# Patient Record
Sex: Male | Born: 1980 | Race: White | Hispanic: No | Marital: Married | Smoking: Current every day smoker
Health system: Southern US, Community
[De-identification: ages and names within clinical notes are randomized; demographics above are authoritative.]

## PROBLEM LIST (undated history)

## (undated) DIAGNOSIS — I10 Essential (primary) hypertension: Secondary | ICD-10-CM

## (undated) DIAGNOSIS — N189 Chronic kidney disease, unspecified: Secondary | ICD-10-CM

## (undated) DIAGNOSIS — F32A Depression, unspecified: Secondary | ICD-10-CM

## (undated) DIAGNOSIS — M5136 Other intervertebral disc degeneration, lumbar region: Secondary | ICD-10-CM

## (undated) DIAGNOSIS — K219 Gastro-esophageal reflux disease without esophagitis: Secondary | ICD-10-CM

## (undated) DIAGNOSIS — M51369 Other intervertebral disc degeneration, lumbar region without mention of lumbar back pain or lower extremity pain: Secondary | ICD-10-CM

## (undated) DIAGNOSIS — F419 Anxiety disorder, unspecified: Secondary | ICD-10-CM

## (undated) DIAGNOSIS — F329 Major depressive disorder, single episode, unspecified: Secondary | ICD-10-CM

## (undated) HISTORY — PX: CARPAL TUNNEL RELEASE: SHX101

## (undated) HISTORY — PX: EYE SURGERY: SHX253

## (undated) HISTORY — PX: NO PAST SURGERIES: SHX2092

---

## 2005-12-29 ENCOUNTER — Emergency Department: Payer: Self-pay | Admitting: Emergency Medicine

## 2005-12-30 ENCOUNTER — Emergency Department: Payer: Self-pay | Admitting: Emergency Medicine

## 2006-07-19 ENCOUNTER — Other Ambulatory Visit: Payer: Self-pay

## 2006-07-19 ENCOUNTER — Emergency Department: Payer: Self-pay | Admitting: Emergency Medicine

## 2007-03-31 ENCOUNTER — Emergency Department: Payer: Self-pay | Admitting: Emergency Medicine

## 2007-03-31 ENCOUNTER — Other Ambulatory Visit: Payer: Self-pay

## 2007-10-08 ENCOUNTER — Emergency Department: Payer: Self-pay | Admitting: Emergency Medicine

## 2009-02-21 ENCOUNTER — Ambulatory Visit (HOSPITAL_BASED_OUTPATIENT_CLINIC_OR_DEPARTMENT_OTHER): Admission: RE | Admit: 2009-02-21 | Discharge: 2009-02-21 | Payer: Self-pay | Admitting: Ophthalmology

## 2009-10-01 ENCOUNTER — Ambulatory Visit (HOSPITAL_COMMUNITY): Admission: RE | Admit: 2009-10-01 | Discharge: 2009-10-01 | Payer: Self-pay | Admitting: Orthopedic Surgery

## 2011-01-19 NOTE — Op Note (Signed)
NAME:  GREGORIO, WORLEY NO.:  [id number]   MEDICAL RECORD NO.:  [id number]          PATIENT TYPE:  AMB   LOCATION:  DSC                          FACILITY:  [facility]   PHYSICIAN:  Pasty Spillers. Maple Hudson, M.D. DATE OF BIRTH:  [birth year]   DATE OF PROCEDURE:  02/21/2009  DATE OF DISCHARGE:                               OPERATIVE REPORT   PREOPERATIVE DIAGNOSES:  1. Sensory exotropia, right eye.  2. Amblyopia, right eye.   POSTOPERATIVE DIAGNOSES:  1. Sensory exotropia, right eye.  2. Amblyopia, right eye.   PROCEDURES:  1. Right lateral rectus muscle recession, 9.0 mm.  2. Right medial rectus muscle resection, 7.0 mm.   SURGEON:  Pasty Spillers. Young, MD   ANESTHESIA:  General (laryngeal mask).   COMPLICATIONS:  None.   PROCEDURE:  After routine preoperative evaluation including informed  consent, the patient was taken to the operating room where he was  identified by me.  General anesthesia was induced without difficulty  after placement of appropriate monitors.  The patient was prepped and  draped in standard sterile fashion.  A lid speculum was placed in the  right eye.   Through an inferotemporal fornix incision through conjunctiva and Tenon  fascia, the right lateral rectus muscle was engaged on a series of  muscle hooks and cleared of its fascial attachments.  The tendon was  secured with a double-arm 6-0 Vicryl suture, with a double-locking bite  at each border of the muscle, 1 mm from the insertion.  The muscle was  disinserted and was reattached to the sclera at a measured distance of  9.0 mm posterior to the original insertion, using direct scleral passes  in crossed-swords fashion.  The suture ends were tied securely after the  position of the muscle had been checked and found to be accurate.  The  conjunctiva was closed with two 6-0 Vicryl sutures.  Through an incision  deep in the inferonasal fornix, adjacent to the plica semilunaris, the  right medial  rectus muscle was engaged on a series of muscle hooks and  carefully cleared of its fascial attachments to at least 15 mm posterior  to the insertion.  The muscle was spread between 2 self-retaining hooks.  A 2-mm bite was taken of the center of muscle belly at a measured  distance of 7.0 mm posterior to the insertion.  A knot was tied securely  at this location.  The needle at each end of the double-arm suture was  passed from the center of the muscle belly to the periphery, parallel to  and 7.0 mm posterior to the insertion.  A double-locking bite was placed  at each border of the muscle.  A resection clamp was placed on the  muscle just anterior to these sutures.  The muscle was disinserted.  Each pole suture was passed posteriorly to anteriorly through the  corresponding end of the muscle stump, then anteriorly to posteriorly  near the center of the stump, then posteriorly to anteriorly through the  center of the muscle belly, just posterior to the previously placed  knot.  All slack was removed  before the suture ends were tied securely.  The clamp was removed.  The portion of the muscle anterior to the  sutures was carefully excised.  Conjunctiva was  closed with two 6-0 Vicryl sutures.  TobraDex ointment was placed in the  eye after the lid speculum had been removed.  The patient was awakened  without difficulty and taken to the recovery room in stable condition,  having suffered no intraoperative or immediate postop complications.      Pasty Spillers. Maple Hudson, M.D.  Electronically Signed     WOY/MEDQ  D:  02/21/2009  T:  02/22/2009  Job:  [id number]

## 2011-07-31 ENCOUNTER — Emergency Department: Payer: Self-pay | Admitting: Emergency Medicine

## 2011-09-22 ENCOUNTER — Ambulatory Visit: Payer: Self-pay | Admitting: Internal Medicine

## 2011-09-23 ENCOUNTER — Other Ambulatory Visit: Payer: Self-pay | Admitting: Urology

## 2011-09-24 ENCOUNTER — Encounter (HOSPITAL_COMMUNITY): Payer: Self-pay | Admitting: Pharmacy Technician

## 2011-09-24 ENCOUNTER — Encounter (HOSPITAL_COMMUNITY): Payer: Self-pay | Admitting: *Deleted

## 2011-09-24 NOTE — Progress Notes (Signed)
Instructed to fill out papers in blue folder. No aspirin, ibuprofen, herbal meds or vitamins. Read every piece of paper in blue folder. Follow instructions for laxative plenty of fluids day prior to ESWL. Light dinner. Must have a driver for home. Clear liquids till 6am then NPO.  Arrive to short stay at 10 am instructed where to park and come in .

## 2011-09-30 ENCOUNTER — Encounter (HOSPITAL_COMMUNITY)
Admission: RE | Disposition: A | Discharge status: Home / Self Care | Payer: Self-pay | Source: Ambulatory Visit | Attending: Urology

## 2011-09-30 ENCOUNTER — Encounter (HOSPITAL_COMMUNITY): Payer: Self-pay | Admitting: *Deleted

## 2011-09-30 ENCOUNTER — Ambulatory Visit (HOSPITAL_COMMUNITY)
Admission: RE | Admit: 2011-09-30 | Discharge: 2011-09-30 | Disposition: A | Discharge status: Home / Self Care | Payer: 59 | Source: Ambulatory Visit | Attending: Urology | Admitting: Urology

## 2011-09-30 ENCOUNTER — Ambulatory Visit (HOSPITAL_COMMUNITY): Payer: 59

## 2011-09-30 DIAGNOSIS — I10 Essential (primary) hypertension: Secondary | ICD-10-CM | POA: Insufficient documentation

## 2011-09-30 DIAGNOSIS — N200 Calculus of kidney: Secondary | ICD-10-CM | POA: Insufficient documentation

## 2011-09-30 HISTORY — DX: Anxiety disorder, unspecified: F41.9

## 2011-09-30 HISTORY — DX: Other intervertebral disc degeneration, lumbar region without mention of lumbar back pain or lower extremity pain: M51.369

## 2011-09-30 HISTORY — DX: Gastro-esophageal reflux disease without esophagitis: K21.9

## 2011-09-30 HISTORY — DX: Depression, unspecified: F32.A

## 2011-09-30 HISTORY — DX: Essential (primary) hypertension: I10

## 2011-09-30 HISTORY — DX: Major depressive disorder, single episode, unspecified: F32.9

## 2011-09-30 HISTORY — DX: Other intervertebral disc degeneration, lumbar region: M51.36

## 2011-09-30 SURGERY — LITHOTRIPSY, ESWL
Anesthesia: LOCAL | Laterality: Left

## 2011-09-30 MED ORDER — CIPROFLOXACIN HCL 500 MG PO TABS
500.0000 mg | ORAL_TABLET | ORAL | Status: AC
  Administered 2011-09-30: 500 mg via ORAL

## 2011-09-30 MED ORDER — DEXTROSE-NACL 5-0.45 % IV SOLN
INTRAVENOUS | Status: DC
  Administered 2011-09-30: 11:00:00 via INTRAVENOUS

## 2011-09-30 MED ORDER — DIPHENHYDRAMINE HCL 25 MG PO CAPS
25.0000 mg | ORAL_CAPSULE | ORAL | Status: AC
  Administered 2011-09-30: 25 mg via ORAL

## 2011-09-30 MED ORDER — DIAZEPAM 5 MG PO TABS
10.0000 mg | ORAL_TABLET | ORAL | Status: AC
  Administered 2011-09-30: 10 mg via ORAL

## 2011-09-30 MED ORDER — DIAZEPAM 5 MG PO TABS: 10.0000 mg | ORAL_TABLET | ORAL | Status: DC

## 2011-09-30 MED ORDER — DIPHENHYDRAMINE HCL 25 MG PO CAPS: 25.0000 mg | ORAL_CAPSULE | ORAL | Status: DC

## 2011-09-30 MED ORDER — OXYCODONE-ACETAMINOPHEN 5-325 MG PO TABS
ORAL_TABLET | ORAL | Status: AC
  Filled 2011-09-30: qty 2

## 2011-09-30 MED ORDER — OXYCODONE-ACETAMINOPHEN 5-325 MG PO TABS
2.0000 | ORAL_TABLET | Freq: Once | ORAL | Status: AC
  Administered 2011-09-30: 2 via ORAL

## 2011-09-30 NOTE — Op Note (Signed)
Refer to [facility] Op Note scanned in the chart 

## 2011-09-30 NOTE — H&P (Signed)
History of Present Illness  Samuel Cabrera presents today with a long history of back pain.  He has degenerative joint disease.  CT scan showed an 11 mm stone in the left kidney. The patient is referred for management of the renal calculus.  The stone is non obstructing.  I do not have a copy of the CT.  He voids well, does not have any flank pain.  He has back pain.  He does not have nausea, vomiting or abdominal pain.   Past Medical History Problems  1. History of  Anxiety (Symptom) 300.00 2. History of  Depression 311 3. History of  Heartburn 787.1 4. History of  Hypertension 401.9  Surgical History Problems  1. History of  Eye Surgery  Current Meds 1. CeleXA 20 MG Oral Tablet; Therapy: (Recorded:17Jan2013) to 2. ClonazePAM 1 MG Oral Tablet; Therapy: (Recorded:17Jan2013) to 3. CloNIDine HCl 0.1 MG Oral Tablet; Therapy: (Recorded:17Jan2013) to 4. Cyclobenzaprine HCl 10 MG Oral Tablet; Therapy: (Recorded:17Jan2013) to  Allergies Medication  1. Amoxicillin TABS 2. Neurontin CAPS  Family History Problems  1. Family history of  Family Health Status - Father's Age 25. Family history of  Family Health Status - Mother's Age 30. Family history of  Family Health Status Number Of Children 4. Family history of  Nephrolithiasis 5. Family history of  Prostate Cancer V16.42  Social History Problems    Alcohol Use   Caffeine Use   Marital History - Currently Married   Tobacco Use 305.1  Review of Systems Genitourinary, constitutional, skin, eye, otolaryngeal, hematologic/lymphatic, cardiovascular, pulmonary, endocrine, musculoskeletal, gastrointestinal, neurological and psychiatric system(s) were reviewed and pertinent findings if present are noted.  Genitourinary: nocturia.  Gastrointestinal: heartburn.  Musculoskeletal: back pain.  Psychiatric: anxiety and depression.    Vitals Vital Signs [Data Includes: Last 1 Day]  17Jan2013 09:40AM  BMI Calculated: 29.8 BSA Calculated:  2.22 Height: 6 ft  Weight: 220 lb  Blood Pressure: 151 / 100 Temperature: 98.7 F Heart Rate: 82 Respiration: 18  Physical Exam Constitutional: Well nourished and well developed . No acute distress.  ENT:. The ears and nose are normal in appearance.  Neck: The appearance of the neck is normal and no neck mass is present.  Pulmonary: No respiratory distress and normal respiratory rhythm and effort.  Cardiovascular: Heart rate and rhythm are normal . No peripheral edema.  Abdomen: The abdomen is soft and nontender. No masses are palpated. No CVA tenderness. No hernias are palpable. No hepatosplenomegaly noted.  Genitourinary: Examination of the penis demonstrates a normal meatus. The penis is circumcised. The scrotum is normal in appearance. Examination of the right scrotum demonstrates no hydrocele. Examination of the left scrotum demostrates no hydrocele. The right spermatic cord is palpably normal. The left spermatic cord is palpably normal. The right testis is palpably normal. The left testis is normal.  Lymphatics: The femoral and inguinal nodes are not enlarged or tender.  Skin: Normal skin turgor, no visible rash and no visible skin lesions.  Neuro/Psych:. Mood and affect are appropriate.    Results/Data Urine [Data Includes: Last 1 Day]  17Jan2013  COLOR: YELLOW  Reference Range YELLOW APPEARANCE: CLEAR  Reference Range CLEAR SPECIFIC GRAVITY: <1.005  Abnormal Low Reference Range 1.005-1.030 pH: 6.5  Reference Range 5.0-8.0 GLUCOSE: NEG mg/dL Reference Range NEG BILIRUBIN: NEG  Reference Range NEG KETONE: NEG mg/dL Reference Range NEG BLOOD: SMALL  Abnormal Reference Range NEG PROTEIN: NEG mg/dL Reference Range NEG UROBILINOGEN: 0.2 mg/dL Reference Range 1.6-1.0 NITRITE: NEG  Reference Range  NEG LEUKOCYTE ESTERASE: NEG  Reference Range NEG SQUAMOUS EPITHELIAL/HPF: NONE SEEN  Reference Range RARE WBC: NONE SEEN WBC/hpf Reference Range <4 RBC: 0-3 RBC/hpf Reference Range  <4 BACTERIA: NONE SEEN  Reference Range RARE CRYSTALS: NONE SEEN  Reference Range NEG CASTS: NONE SEEN  Reference Range NEG  Assessment Assessed  1. Nephrolithiasis Of The Left Kidney 592.0  Plan Health Maintenance (V70.0)  1. UA With REFLEX  Done: 17Jan2013 08:28AM 2. Follow-up Schedule Surgery Office  Follow-up  Done: 17Jan2013   Obtain copy of the CT.  I doubt that his back pain is related to the non obstructing renal calculus.  In view of the size of the stone I believe he needs ESL.  The procedure, risks, benefits were explained to him.  The risks include but are not limited to hemorrhage, injury to adjacent organs, renal or perirenal hematoma, inability to fragment the stone. He also understands that may not relieve his back pain.   Signatures  CC: Dr Aram Beecham  Electronically signed by : Samuel Cabrera, M.D.; Sep 23 2011  2:48PM

## 2011-10-15 ENCOUNTER — Ambulatory Visit: Payer: Self-pay | Admitting: Internal Medicine

## 2012-05-19 ENCOUNTER — Ambulatory Visit: Payer: Self-pay | Admitting: Unknown Physician Specialty

## 2012-07-10 ENCOUNTER — Emergency Department: Payer: Self-pay | Admitting: Emergency Medicine

## 2012-08-17 ENCOUNTER — Ambulatory Visit: Payer: Self-pay | Admitting: Pain Medicine

## 2012-08-25 ENCOUNTER — Ambulatory Visit: Payer: Self-pay | Admitting: Pain Medicine

## 2012-09-01 ENCOUNTER — Emergency Department: Payer: Self-pay | Admitting: Emergency Medicine

## 2012-10-03 ENCOUNTER — Other Ambulatory Visit: Payer: Self-pay | Admitting: Neurological Surgery

## 2012-10-09 ENCOUNTER — Encounter (HOSPITAL_COMMUNITY): Payer: Self-pay | Admitting: Pharmacy Technician

## 2012-10-13 ENCOUNTER — Encounter (HOSPITAL_COMMUNITY): Payer: Self-pay

## 2012-10-13 ENCOUNTER — Encounter (HOSPITAL_COMMUNITY)
Admission: RE | Admit: 2012-10-13 | Discharge: 2012-10-13 | Disposition: A | Discharge status: Home / Self Care | Payer: 59 | Source: Ambulatory Visit | Attending: Neurological Surgery | Admitting: Neurological Surgery

## 2012-10-13 ENCOUNTER — Ambulatory Visit (HOSPITAL_COMMUNITY)
Admission: RE | Admit: 2012-10-13 | Discharge: 2012-10-13 | Disposition: A | Discharge status: Home / Self Care | Payer: 59 | Source: Ambulatory Visit | Attending: Neurological Surgery | Admitting: Neurological Surgery

## 2012-10-13 HISTORY — DX: Chronic kidney disease, unspecified: N18.9

## 2012-10-13 LAB — BASIC METABOLIC PANEL
Calcium: 10 mg/dL (ref 8.4–10.5)
GFR calc Af Amer: >90 mL/min (ref >90)
GFR calc non Af Amer: >90 mL/min (ref >90)
Potassium: 4.3 mEq/L (ref 3.5–5.1)
Sodium: 139 mEq/L (ref 135–145)

## 2012-10-13 LAB — CBC WITH DIFFERENTIAL/PLATELET
Eosinophils Absolute: 0.1 10*3/uL (ref 0.0–0.7)
Hemoglobin: 15.7 g/dL (ref 13.0–17.0)
Lymphocytes Relative: 46 % (ref 12–46)
Lymphs Abs: 2.7 10*3/uL (ref 0.7–4.0)
MCH: 31.9 pg (ref 26.0–34.0)
Neutro Abs: 2.7 10*3/uL (ref 1.7–7.7)
Neutrophils Relative %: 44 % (ref 43–77)
Platelets: 250 10*3/uL (ref 150–400)
RBC: 4.92 MIL/uL (ref 4.22–5.81)
WBC: 6 10*3/uL (ref 4.0–10.5)

## 2012-10-13 LAB — SURGICAL PCR SCREEN
MRSA, PCR: POSITIVE — AB
Staphylococcus aureus: POSITIVE — AB

## 2012-10-13 LAB — PROTIME-INR
INR: 0.97 (ref 0.00–1.49)
Prothrombin Time: 12.8 seconds (ref 11.6–15.2)

## 2012-10-13 NOTE — Progress Notes (Signed)
Pt. Reports that he was seeing a psych. & was given Clonidine because his BP was elevated & he was suffering from a lot of stress.  Pt. Admits that he did not take it yesterday or today.

## 2012-10-13 NOTE — Pre-Procedure Instructions (Signed)
[name]  10/13/2012   Your procedure is scheduled on:  10/20/2012  Report to [facility] at 9:30 AM.  Call this number if you have problems the morning of surgery: [phone]   Remember:   Do not eat food or drink liquids after midnight. On Thursday  Take these medicines the morning of surgery with A SIP OF WATER: Clonazepam, Clonidine , Cymbalta, Zantac   Do not wear jewelry  Do not wear lotions, powders, or perfumes. You may wear deodorant.              You  may shave face and neck.  Do not bring valuables to the hospital.  Contacts, dentures or bridgework may not be worn into surgery.  Leave suitcase in the car. After surgery it may be brought to your room.  For patients admitted to the hospital, checkout time is 11:00 AM the day of  discharge.   Patients discharged the day of surgery will not be allowed to drive  home.  Name and phone number of your driver: /w spouse  Special Instructions: Shower using CHG 2 nights before surgery and the night before surgery.  If you shower the day of surgery use CHG.  Use special wash - you have one bottle of CHG for all showers.  You should use approximately 1/3 of the bottle for each shower.   Please read over the following fact sheets that you were given: Pain Booklet, Coughing and Deep Breathing, MRSA Information and Surgical Site Infection Prevention

## 2012-10-19 MED ORDER — VANCOMYCIN HCL 10 G IV SOLR
1500.0000 mg | INTRAVENOUS | Status: AC
  Administered 2012-10-20: 1500 mg via INTRAVENOUS
  Filled 2012-10-19 (×2): qty 1500

## 2012-10-19 MED ORDER — DEXAMETHASONE SODIUM PHOSPHATE 10 MG/ML IJ SOLN
10.0000 mg | INTRAMUSCULAR | Status: AC
  Administered 2012-10-20: 10 mg via INTRAVENOUS
  Filled 2012-10-19: qty 1

## 2012-10-20 ENCOUNTER — Ambulatory Visit (HOSPITAL_COMMUNITY): Payer: 59 | Admitting: Anesthesiology

## 2012-10-20 ENCOUNTER — Ambulatory Visit (HOSPITAL_COMMUNITY): Payer: 59

## 2012-10-20 ENCOUNTER — Encounter (HOSPITAL_COMMUNITY): Payer: Self-pay | Admitting: Neurological Surgery

## 2012-10-20 ENCOUNTER — Encounter (HOSPITAL_COMMUNITY): Payer: Self-pay | Admitting: Anesthesiology

## 2012-10-20 ENCOUNTER — Encounter (HOSPITAL_COMMUNITY)
Admission: RE | Disposition: A | Discharge status: Home / Self Care | Payer: Self-pay | Source: Ambulatory Visit | Attending: Neurological Surgery

## 2012-10-20 ENCOUNTER — Ambulatory Visit (HOSPITAL_COMMUNITY)
Admission: RE | Admit: 2012-10-20 | Discharge: 2012-10-20 | Disposition: A | Discharge status: Home / Self Care | Payer: 59 | Source: Ambulatory Visit | Attending: Neurological Surgery | Admitting: Neurological Surgery

## 2012-10-20 DIAGNOSIS — Z01818 Encounter for other preprocedural examination: Secondary | ICD-10-CM | POA: Insufficient documentation

## 2012-10-20 DIAGNOSIS — M47812 Spondylosis without myelopathy or radiculopathy, cervical region: Secondary | ICD-10-CM | POA: Insufficient documentation

## 2012-10-20 DIAGNOSIS — Z01812 Encounter for preprocedural laboratory examination: Secondary | ICD-10-CM | POA: Insufficient documentation

## 2012-10-20 DIAGNOSIS — Z981 Arthrodesis status: Secondary | ICD-10-CM

## 2012-10-20 DIAGNOSIS — Z01810 Encounter for preprocedural cardiovascular examination: Secondary | ICD-10-CM | POA: Insufficient documentation

## 2012-10-20 DIAGNOSIS — I10 Essential (primary) hypertension: Secondary | ICD-10-CM | POA: Insufficient documentation

## 2012-10-20 DIAGNOSIS — M5020 Other cervical disc displacement, unspecified cervical region: Secondary | ICD-10-CM | POA: Insufficient documentation

## 2012-10-20 HISTORY — PX: ANTERIOR CERVICAL DECOMP/DISCECTOMY FUSION: SHX1161

## 2012-10-20 SURGERY — ANTERIOR CERVICAL DECOMPRESSION/DISCECTOMY FUSION 1 LEVEL
Anesthesia: General | Site: Spine Cervical | Wound class: Clean

## 2012-10-20 MED ORDER — BUPIVACAINE HCL (PF) 0.25 % IJ SOLN
INTRAMUSCULAR | Status: DC | PRN
  Administered 2012-10-20: 5 mL

## 2012-10-20 MED ORDER — OXYCODONE HCL 5 MG PO TABS
ORAL_TABLET | ORAL | Status: AC
  Filled 2012-10-20: qty 1

## 2012-10-20 MED ORDER — HYDROMORPHONE HCL PF 1 MG/ML IJ SOLN
INTRAMUSCULAR | Status: AC
  Administered 2012-10-20: 0.5 mg
  Filled 2012-10-20: qty 1

## 2012-10-20 MED ORDER — OXYCODONE-ACETAMINOPHEN 5-325 MG PO TABS
1.0000 | ORAL_TABLET | ORAL | Status: DC | PRN
  Administered 2012-10-20: 2 via ORAL
  Filled 2012-10-20: qty 2

## 2012-10-20 MED ORDER — CYCLOBENZAPRINE HCL 10 MG PO TABS
ORAL_TABLET | ORAL | Status: AC
  Filled 2012-10-20: qty 1

## 2012-10-20 MED ORDER — LACTATED RINGERS IV SOLN
INTRAVENOUS | Status: DC | PRN
  Administered 2012-10-20 (×2): via INTRAVENOUS

## 2012-10-20 MED ORDER — SODIUM CHLORIDE 0.9 % IJ SOLN: 3.0000 mL | Freq: Two times a day (BID) | INTRAMUSCULAR | Status: DC

## 2012-10-20 MED ORDER — DEXAMETHASONE SODIUM PHOSPHATE 4 MG/ML IJ SOLN: 4.0000 mg | Freq: Four times a day (QID) | INTRAMUSCULAR | Status: DC

## 2012-10-20 MED ORDER — ROCURONIUM BROMIDE 100 MG/10ML IV SOLN
INTRAVENOUS | Status: DC | PRN
  Administered 2012-10-20: 50 mg via INTRAVENOUS

## 2012-10-20 MED ORDER — ACETAMINOPHEN 325 MG PO TABS: 650.0000 mg | ORAL_TABLET | ORAL | Status: DC | PRN

## 2012-10-20 MED ORDER — CYCLOBENZAPRINE HCL 10 MG PO TABS
10.0000 mg | ORAL_TABLET | Freq: Three times a day (TID) | ORAL | Status: DC | PRN
  Administered 2012-10-20: 10 mg via ORAL

## 2012-10-20 MED ORDER — VECURONIUM BROMIDE 10 MG IV SOLR
INTRAVENOUS | Status: DC | PRN
Start: 2012-10-20 — End: 2012-10-20
  Administered 2012-10-20: 3 mg via INTRAVENOUS
  Administered 2012-10-20: 2 mg via INTRAVENOUS

## 2012-10-20 MED ORDER — PROPOFOL 10 MG/ML IV BOLUS
INTRAVENOUS | Status: DC | PRN
  Administered 2012-10-20: 200 mg via INTRAVENOUS

## 2012-10-20 MED ORDER — CLONIDINE HCL 0.1 MG PO TABS: 0.1000 mg | ORAL_TABLET | Freq: Every day | ORAL | Status: DC

## 2012-10-20 MED ORDER — SODIUM CHLORIDE 0.9 % IR SOLN
Status: DC | PRN
  Administered 2012-10-20: 09:00:00

## 2012-10-20 MED ORDER — ONDANSETRON HCL 4 MG/2ML IJ SOLN: 4.0000 mg | Freq: Once | INTRAMUSCULAR | Status: DC | PRN

## 2012-10-20 MED ORDER — DEXAMETHASONE 4 MG PO TABS
4.0000 mg | ORAL_TABLET | Freq: Four times a day (QID) | ORAL | Status: DC
  Administered 2012-10-20: 4 mg via ORAL
  Filled 2012-10-20: qty 1

## 2012-10-20 MED ORDER — GLYCOPYRROLATE 0.2 MG/ML IJ SOLN
INTRAMUSCULAR | Status: DC | PRN
  Administered 2012-10-20: 0.6 mg via INTRAVENOUS

## 2012-10-20 MED ORDER — CEFAZOLIN SODIUM 1-5 GM-% IV SOLN: 1.0000 g | Freq: Three times a day (TID) | INTRAVENOUS | Status: DC

## 2012-10-20 MED ORDER — CYCLOBENZAPRINE HCL 10 MG PO TABS: 10.0000 mg | ORAL_TABLET | Freq: Three times a day (TID) | ORAL | Status: DC | PRN

## 2012-10-20 MED ORDER — ACETAMINOPHEN 10 MG/ML IV SOLN
1000.0000 mg | Freq: Four times a day (QID) | INTRAVENOUS | Status: DC
  Administered 2012-10-20: 1000 mg via INTRAVENOUS
  Filled 2012-10-20 (×2): qty 100

## 2012-10-20 MED ORDER — MORPHINE SULFATE 2 MG/ML IJ SOLN
1.0000 mg | INTRAMUSCULAR | Status: DC | PRN
Start: 2012-10-20 — End: 2012-10-20

## 2012-10-20 MED ORDER — SODIUM CHLORIDE 0.9 % IV SOLN
INTRAVENOUS | Status: AC
  Filled 2012-10-20: qty 500

## 2012-10-20 MED ORDER — OXYCODONE HCL 5 MG/5ML PO SOLN: 5.0000 mg | Freq: Once | ORAL | Status: AC | PRN

## 2012-10-20 MED ORDER — OXYCODONE-ACETAMINOPHEN 5-325 MG PO TABS: 1.0000 | ORAL_TABLET | Freq: Four times a day (QID) | ORAL | Status: DC | PRN

## 2012-10-20 MED ORDER — HEMOSTATIC AGENTS (NO CHARGE) OPTIME
TOPICAL | Status: DC | PRN
  Administered 2012-10-20: 1 via TOPICAL

## 2012-10-20 MED ORDER — THROMBIN 5000 UNITS EX SOLR
OROMUCOSAL | Status: DC | PRN
  Administered 2012-10-20: 09:00:00 via TOPICAL

## 2012-10-20 MED ORDER — ACETAMINOPHEN 650 MG RE SUPP: 650.0000 mg | RECTAL | Status: DC | PRN

## 2012-10-20 MED ORDER — SODIUM CHLORIDE 0.9 % IJ SOLN: 3.0000 mL | INTRAMUSCULAR | Status: DC | PRN

## 2012-10-20 MED ORDER — MEPERIDINE HCL 25 MG/ML IJ SOLN: 6.2500 mg | INTRAMUSCULAR | Status: DC | PRN

## 2012-10-20 MED ORDER — VANCOMYCIN HCL IN DEXTROSE 1-5 GM/200ML-% IV SOLN
INTRAVENOUS | Status: AC
  Filled 2012-10-20: qty 200

## 2012-10-20 MED ORDER — ONDANSETRON HCL 4 MG/2ML IJ SOLN
INTRAMUSCULAR | Status: DC | PRN
  Administered 2012-10-20: 4 mg via INTRAVENOUS

## 2012-10-20 MED ORDER — ARTIFICIAL TEARS OP OINT
TOPICAL_OINTMENT | OPHTHALMIC | Status: DC | PRN
  Administered 2012-10-20: 1 via OPHTHALMIC

## 2012-10-20 MED ORDER — LIDOCAINE HCL (CARDIAC) 20 MG/ML IV SOLN
INTRAVENOUS | Status: DC | PRN
  Administered 2012-10-20 (×2): 100 mg via INTRAVENOUS

## 2012-10-20 MED ORDER — HYDROMORPHONE HCL PF 1 MG/ML IJ SOLN
INTRAMUSCULAR | Status: AC
  Filled 2012-10-20: qty 1

## 2012-10-20 MED ORDER — MIDAZOLAM HCL 5 MG/5ML IJ SOLN
INTRAMUSCULAR | Status: DC | PRN
  Administered 2012-10-20 (×2): 2 mg via INTRAVENOUS

## 2012-10-20 MED ORDER — VANCOMYCIN HCL IN DEXTROSE 1-5 GM/200ML-% IV SOLN
1000.0000 mg | Freq: Once | INTRAVENOUS | Status: DC
  Filled 2012-10-20: qty 200

## 2012-10-20 MED ORDER — ACETAMINOPHEN 10 MG/ML IV SOLN
INTRAVENOUS | Status: AC
  Administered 2012-10-20: 1000 mg via INTRAVENOUS
  Filled 2012-10-20: qty 100

## 2012-10-20 MED ORDER — SODIUM CHLORIDE 0.9 % IV SOLN
INTRAVENOUS | Status: DC | PRN
  Administered 2012-10-20: 08:00:00 via INTRAVENOUS

## 2012-10-20 MED ORDER — THROMBIN 5000 UNITS EX KIT
PACK | CUTANEOUS | Status: DC | PRN
  Administered 2012-10-20 (×2): 5000 [IU] via TOPICAL

## 2012-10-20 MED ORDER — MENTHOL 3 MG MT LOZG: 1.0000 | LOZENGE | OROMUCOSAL | Status: DC | PRN

## 2012-10-20 MED ORDER — OXYCODONE HCL 5 MG PO TABS
5.0000 mg | ORAL_TABLET | Freq: Once | ORAL | Status: AC | PRN
  Administered 2012-10-20: 5 mg via ORAL

## 2012-10-20 MED ORDER — DULOXETINE HCL 60 MG PO CPEP
60.0000 mg | ORAL_CAPSULE | Freq: Every day | ORAL | Status: DC
  Filled 2012-10-20: qty 1

## 2012-10-20 MED ORDER — HYDROMORPHONE HCL PF 1 MG/ML IJ SOLN
0.2500 mg | INTRAMUSCULAR | Status: DC | PRN
  Administered 2012-10-20 (×3): 0.5 mg via INTRAVENOUS

## 2012-10-20 MED ORDER — NEOSTIGMINE METHYLSULFATE 1 MG/ML IJ SOLN
INTRAMUSCULAR | Status: DC | PRN
  Administered 2012-10-20: 5 mg via INTRAVENOUS

## 2012-10-20 MED ORDER — FENTANYL CITRATE 0.05 MG/ML IJ SOLN
INTRAMUSCULAR | Status: DC | PRN
  Administered 2012-10-20: 150 ug via INTRAVENOUS
  Administered 2012-10-20 (×2): 100 ug via INTRAVENOUS

## 2012-10-20 MED ORDER — POTASSIUM CHLORIDE IN NACL 20-0.9 MEQ/L-% IV SOLN
INTRAVENOUS | Status: DC
  Filled 2012-10-20 (×2): qty 1000

## 2012-10-20 MED ORDER — EPHEDRINE SULFATE 50 MG/ML IJ SOLN
INTRAMUSCULAR | Status: DC | PRN
  Administered 2012-10-20: 10 mg via INTRAVENOUS

## 2012-10-20 MED ORDER — ONDANSETRON HCL 4 MG/2ML IJ SOLN: 4.0000 mg | INTRAMUSCULAR | Status: DC | PRN

## 2012-10-20 MED ORDER — BACITRACIN 50000 UNITS IM SOLR
INTRAMUSCULAR | Status: AC
  Filled 2012-10-20: qty 1

## 2012-10-20 MED ORDER — CHLORHEXIDINE GLUCONATE CLOTH 2 % EX PADS: 6.0000 | MEDICATED_PAD | Freq: Every day | CUTANEOUS | Status: DC

## 2012-10-20 MED ORDER — 0.9 % SODIUM CHLORIDE (POUR BTL) OPTIME
TOPICAL | Status: DC | PRN
  Administered 2012-10-20: 1000 mL

## 2012-10-20 MED ORDER — PHENOL 1.4 % MT LIQD: 1.0000 | OROMUCOSAL | Status: DC | PRN

## 2012-10-20 MED ORDER — CLONAZEPAM 0.5 MG PO TABS: 1.0000 mg | ORAL_TABLET | Freq: Two times a day (BID) | ORAL | Status: DC | PRN

## 2012-10-20 SURGICAL SUPPLY — 45 items
BAG DECANTER FOR FLEXI CONT (MISCELLANEOUS) ×2 IMPLANT
BENZOIN TINCTURE PRP APPL 2/3 (GAUZE/BANDAGES/DRESSINGS) ×2 IMPLANT
BUR MATCHSTICK NEURO 3.0 LAGG (BURR) ×2 IMPLANT
CAGE LORDOTIC 8 SM PLUS (Cage) ×2 IMPLANT
CANISTER SUCTION 2500CC (MISCELLANEOUS) ×2 IMPLANT
CLOTH BEACON ORANGE TIMEOUT ST (SAFETY) ×2 IMPLANT
CONT SPEC 4OZ CLIKSEAL STRL BL (MISCELLANEOUS) ×2 IMPLANT
DRAPE C-ARM 42X72 X-RAY (DRAPES) ×4 IMPLANT
DRAPE LAPAROTOMY 100X72 PEDS (DRAPES) ×2 IMPLANT
DRAPE MICROSCOPE ZEISS OPMI (DRAPES) ×2 IMPLANT
DRAPE POUCH INSTRU U-SHP 10X18 (DRAPES) ×2 IMPLANT
DRESSING TELFA 8X3 (GAUZE/BANDAGES/DRESSINGS) ×2 IMPLANT
DRSG OPSITE 4X5.5 SM (GAUZE/BANDAGES/DRESSINGS) ×2 IMPLANT
DURAPREP 6ML APPLICATOR 50/CS (WOUND CARE) ×2 IMPLANT
ELECT COATED BLADE 2.86 ST (ELECTRODE) ×2 IMPLANT
ELECT REM PT RETURN 9FT ADLT (ELECTROSURGICAL) ×2
ELECTRODE REM PT RTRN 9FT ADLT (ELECTROSURGICAL) ×1 IMPLANT
GAUZE SPONGE 4X4 16PLY XRAY LF (GAUZE/BANDAGES/DRESSINGS) IMPLANT
GLOVE BIO SURGEON STRL SZ8 (GLOVE) ×2 IMPLANT
GOWN BRE IMP SLV AUR LG STRL (GOWN DISPOSABLE) IMPLANT
GOWN BRE IMP SLV AUR XL STRL (GOWN DISPOSABLE) IMPLANT
GOWN STRL REIN 2XL LVL4 (GOWN DISPOSABLE) ×2 IMPLANT
HEMOSTAT POWDER KIT SURGIFOAM (HEMOSTASIS) ×2 IMPLANT
HEMOSTAT POWDER SURGIFOAM 1G (HEMOSTASIS) ×2 IMPLANT
KIT BASIN OR (CUSTOM PROCEDURE TRAY) ×2 IMPLANT
KIT ROOM TURNOVER OR (KITS) ×2 IMPLANT
NEEDLE HYPO 25X1 1.5 SAFETY (NEEDLE) ×2 IMPLANT
NEEDLE SPNL 20GX3.5 QUINCKE YW (NEEDLE) ×2 IMPLANT
NS IRRIG 1000ML POUR BTL (IV SOLUTION) ×2 IMPLANT
PACK LAMINECTOMY NEURO (CUSTOM PROCEDURE TRAY) ×2 IMPLANT
PAD ARMBOARD 7.5X6 YLW CONV (MISCELLANEOUS) ×2 IMPLANT
PLATE HELIX R 22MM (Plate) ×2 IMPLANT
PUTTY BONE DBX 2.5 MIS (Bone Implant) ×2 IMPLANT
RUBBERBAND STERILE (MISCELLANEOUS) ×4 IMPLANT
SCREW 4.0X13 (Screw) ×4 IMPLANT
SCREW 4.0X13MM (Screw) ×4 IMPLANT
SPONGE INTESTINAL PEANUT (DISPOSABLE) ×2 IMPLANT
SPONGE SURGIFOAM ABS GEL SZ50 (HEMOSTASIS) ×2 IMPLANT
STRIP CLOSURE SKIN 1/2X4 (GAUZE/BANDAGES/DRESSINGS) ×2 IMPLANT
SUT VIC AB 3-0 SH 8-18 (SUTURE) ×2 IMPLANT
SYR 20ML ECCENTRIC (SYRINGE) ×2 IMPLANT
TOWEL OR 17X24 6PK STRL BLUE (TOWEL DISPOSABLE) ×2 IMPLANT
TOWEL OR 17X26 10 PK STRL BLUE (TOWEL DISPOSABLE) ×2 IMPLANT
TRAP SPECIMEN MUCOUS 40CC (MISCELLANEOUS) IMPLANT
WATER STERILE IRR 1000ML POUR (IV SOLUTION) ×2 IMPLANT

## 2012-10-20 NOTE — Transfer of Care (Signed)
Immediate Anesthesia Transfer of Care Note  Patient: Samuel Cabrera  Procedure(s) Performed: Procedure(s): ANTERIOR CERVICAL DECOMPRESSION/DISCECTOMY FUSION 1 LEVEL (N/A)  Patient Location: PACU  Anesthesia Type:General  Level of Consciousness: oriented, sedated, patient cooperative and responds to stimulation  Airway & Oxygen Therapy: Patient Spontanous Breathing and Patient connected to nasal cannula oxygen  Post-op Assessment: Report given to PACU RN, Post -op Vital signs reviewed and stable, Patient moving all extremities and Patient moving all extremities X 4  Post vital signs: Reviewed and stable  Complications: No apparent anesthesia complications

## 2012-10-20 NOTE — Anesthesia Postprocedure Evaluation (Signed)
Anesthesia Post Note  Patient: Samuel Cabrera  Procedure(s) Performed: Procedure(s) (LRB): ANTERIOR CERVICAL DECOMPRESSION/DISCECTOMY FUSION 1 LEVEL (N/A)  Anesthesia type: general  Patient location: PACU  Post pain: Pain level controlled  Post assessment: Patient's Cardiovascular Status Stable  Last Vitals:  Filed Vitals:   10/20/12 1108  BP:   Pulse: 84  Temp:   Resp: 15    Post vital signs: Reviewed and stable  Level of consciousness: sedated  Complications: No apparent anesthesia complications

## 2012-10-20 NOTE — Anesthesia Preprocedure Evaluation (Signed)
Anesthesia Evaluation  Patient identified by MRN, date of birth, ID band Patient awake    Reviewed: Allergy & Precautions, H&P , NPO status , Patient's Chart, lab work & pertinent test results  Airway Mallampati: I  Neck ROM: Full    Dental   Pulmonary          Cardiovascular hypertension, Pt. on medications     Neuro/Psych    GI/Hepatic GERD-  Medicated and Controlled,  Endo/Other    Renal/GU      Musculoskeletal   Abdominal   Peds  Hematology   Anesthesia Other Findings   Reproductive/Obstetrics                           Anesthesia Physical Anesthesia Plan  ASA: II  Anesthesia Plan: General   Post-op Pain Management:    Induction: Intravenous  Airway Management Planned: Oral ETT  Additional Equipment:   Intra-op Plan:   Post-operative Plan: Extubation in OR  Informed Consent: I have reviewed the patients History and Physical, chart, labs and discussed the procedure including the risks, benefits and alternatives for the proposed anesthesia with the patient or authorized representative who has indicated his/her understanding and acceptance.     Plan Discussed with: CRNA and Surgeon  Anesthesia Plan Comments:         Anesthesia Quick Evaluation

## 2012-10-20 NOTE — Plan of Care (Signed)
Problem: Consults Goal: Diagnosis - Spinal Surgery Outcome: Completed/Met Date Met:  10/20/12 Cervical Spine Fusion

## 2012-10-20 NOTE — Preoperative (Signed)
Beta Blockers   Reason not to administer Beta Blockers:Not Applicable 

## 2012-10-20 NOTE — Op Note (Signed)
10/20/2012  10:26 AM  PATIENT:  Blenda Mounts  32 y.o. 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 Decompressive anterior cervical discectomy C5-6, 2. Anterior cervical arthrodesis C5-6 utilizing an 8 mm peek interbody cage packed with local autograft and morcellized allograft, 3. After cervical plating C5-6 utilizing a helix plate  SURGEON:  Marikay Alar, MD  ASSISTANTS: Dr. Phoebe Perch  ANESTHESIA:   General  EBL: 50 ml  Total I/O In: 1500 [I.V.:1500] Out: -   BLOOD ADMINISTERED:none  DRAINS: None   SPECIMEN:  No Specimen  INDICATION FOR PROCEDURE: This patient presented with a long history of neck and left arm pain. MRI showed spondylosis at C5-6 with left-sided disc herniation compressing the left C6 nerve root. He tried medical management without relief. Recommended a ACDF at C5-6. Patient understood the risks, benefits, and alternatives and potential outcomes and wished to proceed.  PROCEDURE DETAILS: Patient was brought to the operating room placed under general endotracheal anesthesia. Patient was placed in the supine position on the operating room table. The neck was prepped with Duraprep and draped in a sterile fashion.   Three cc of local anesthesia was injected and a transverse incision was made on the right side of the neck.  Dissection was carried down thru the subcutaneous tissue and the platysma was  elevated, opened, and undermined with Metzenbaum scissors.  Dissection was then carried out thru an avascular plane leaving the sternocleidomastoid carotid artery and jugular vein laterally and the trachea and esophagus medially. The ventral aspect of the vertebral column was identified and a localizing x-ray was taken. The C5-6 level was identified. The longus colli muscles were then elevated and the retractor was placed. The annulus was incised and the  disc space entered. Discectomy was performed with micro-curettes and pituitary rongeurs. I then used the high-speed drill to drill the endplates down to the level of the posterior longitudinal ligament. The drill shavings were saved in a mucous trap for later arthrodesis. The operating microscope was draped and brought into the field provided additional magnification, illumination and visualization. Discectomy was continued posteriorly thru the disc space. Posterior longitudinal ligament was opened with a nerve hook, and then removed along with disc herniation and osteophytes, decompressing the spinal canal and thecal sac. We then continued to remove osteophytic overgrowth and disc material decompressing the neural foramina and exiting nerve roots bilaterally. The scope was angled up and down to help decompress and undercut the vertebral bodies. Once the decompression was completed we could pass a nerve hook circumferentially to assure adequate decompression in the midline and in the neural foramina. So by both visualization and palpation we felt we had an adequate decompression of the neural elements. We then measured the height of the intravertebral disc space and selected a 8 millimeter Peek interbody cage packed with autograft and morcellized allograft. It was then gently positioned in the intravertebral disc space and countersunk. I then used a helix plate and placed four variable angle screws into the vertebral bodies and locked them into position. The wound was irrigated with bacitracin solution, checked for hemostasis which was established and confirmed. Once meticulous hemostasis was achieved, we then proceeded with closure. The platysma was closed with interrupted 3-0 undyed Vicryl suture, the subcuticular layer was closed with interrupted 3-0 undyed Vicryl suture. The skin edges were approximated with steristrips. The drapes were removed. A sterile dressing was  applied. The patient was then awakened from  general anesthesia and transferred to the recovery room in stable condition. At the end of the procedure all sponge, needle and instrument counts were correct.   PLAN OF CARE: Admit for overnight observation  PATIENT DISPOSITION:  PACU - hemodynamically stable.   Delay start of Pharmacological VTE agent (>24hrs) due to surgical blood loss or risk of bleeding:  yes

## 2012-10-20 NOTE — Addendum Note (Signed)
Addendum created 10/20/12 1226 by Elisabeth Most, CRNA   Modules edited: Anesthesia Flowsheet, Anesthesia Medication Administration

## 2012-10-20 NOTE — Progress Notes (Signed)
Pt. discharged home accompanied by spouse. Prescriptions and discharge instructions given with verbalization of understanding. Incision site on neck with no s/s of infection - no swelling, redness, bleeding, and/or drainage noted. Opportunity given to ask questions but no question asked. Pt. transported home by spouse.

## 2012-10-20 NOTE — H&P (Signed)
Subjective:   Patient is a 32 y.o. male admitted for ACDF. The patient first presented to me with complaints of neck and arm pain. Onset of symptoms was many months ago. The pain is described as aching and occurs all day. The pain is rated severe, and is located at the base of the neck and radiates to the arms. The symptoms have been progressive. Symptoms are exacerbated by activity, and are relieved by meds.  Previous work up includes MRI which shows spondylosis at C5-6.Marland Kitchen  Past Medical History  Diagnosis Date  . GERD (gastroesophageal reflux disease)   . Anxiety   . Depression     medical treatment, states psych. gave him Clonidine in addition to other drugs for depression & ^BP  . Chronic kidney disease     lithotripsy- 2013  . Degenerative disc disease, lumbar     in lower back, hnp- cervical  . Hypertension     medical tretment for 2-95yrs, yet reports lack of compliance on & off. Pt. agrees to take now leading up to surgery    Past Surgical History  Procedure Laterality Date  . No past surgeries    . 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 Carpal tunnel release      both hands     Allergies  Allergen Reactions  . Amoxicillin Anaphylaxis  . Neurontin (Gabapentin) Other (See Comments)    hallucinations    History  Substance Use Topics  . Smoking status: Former Smoker -- 1.00 packs/day for 10 years    Types: Cigarettes    Quit date: 10/13/2009  . Smokeless tobacco: Former Neurosurgeon  . Alcohol Use: 1.2 oz/week    2 Cans of beer per week     Comment: daily    No family history on file. Prior to Admission medications   Medication Sig Start Date End Date Taking? Authorizing Provider  clonazePAM (KLONOPIN) 1 MG tablet Take 1 mg by mouth 2 (two) times daily as needed.    Yes Historical Provider, MD  cloNIDine (CATAPRES) 0.1 MG tablet Take 0.1 mg by mouth daily.    Yes Historical Provider, MD  DULoxetine (CYMBALTA) 60 MG capsule Take 60 mg by mouth daily  before breakfast.    Yes Historical Provider, MD  oxyCODONE-acetaminophen (PERCOCET/ROXICET) 5-325 MG per tablet Take 1-2 tablets by mouth every 8 (eight) hours as needed. For pain   Yes Historical Provider, MD  ranitidine (ZANTAC) 150 MG tablet Take 150 mg by mouth 2 (two) times daily as needed.   Yes Historical Provider, MD     Review of Systems  Positive ROS: neg  All other systems have been reviewed and were otherwise negative with the exception of those mentioned in the HPI and as above.  Objective: Vital signs in last 24 hours: Temp:  [98.8 F (37.1 C)] 98.8 F (37.1 C) (02/14 0631) Pulse Rate:  [97] 97 (02/14 0631) Resp:  [18] 18 (02/14 0631) BP: (130)/(91) 130/91 mmHg (02/14 0631) SpO2:  [97 %] 97 % (02/14 0631)  General Appearance: Alert, cooperative, no distress, appears stated age Head: Normocephalic, without obvious abnormality, atraumatic Eyes: PERRL, conjunctiva/corneas clear, EOM's intact    Neck: Supple, symmetrical, trachea midline Back: Symmetric Lungs: , respirations unlabored Heart: Regular rate and rhythm Abdomen: Soft, non-tender, bowel sounds active all four quadrants, no masses, no organomegaly Extremities: Extremities normal, atraumatic, no cyanosis or edema Pulses: 2+ and symmetric all extremities Skin: Skin color, texture, turgor  normal, no rashes or lesions  NEUROLOGIC:  Mental status: Alert and oriented x4, no aphasia, good attention span, fund of knowledge and memory  Motor Exam - grossly normal Sensory Exam - grossly normal Reflexes: 1+ Coordination - grossly normal Gait - grossly normal Balance - grossly normal Cranial Nerves: I: smell Not tested  II: visual acuity  OS: nl    OD: nl  II: visual fields Full to confrontation  II: pupils Equal, round, reactive to light  III,VII: ptosis None  III,IV,VI: extraocular muscles  Full ROM  V: mastication Normal  V: facial light touch sensation  Normal  V,VII: corneal reflex  Present  VII:  facial muscle function - upper  Normal  VII: facial muscle function - lower Normal  VIII: hearing Not tested  IX: soft palate elevation  Normal  IX,X: gag reflex Present  XI: trapezius strength  5/5  XI: sternocleidomastoid strength 5/5  XI: neck flexion strength  5/5  XII: tongue strength  Normal    Data Review Lab Results  Component Value Date   WBC 6.0 10/13/2012   HGB 15.7 10/13/2012   HCT 43.1 10/13/2012   MCV 87.6 10/13/2012   PLT 250 10/13/2012   Lab Results  Component Value Date   NA 139 10/13/2012   K 4.3 10/13/2012   CL 99 10/13/2012   CO2 30 10/13/2012   BUN 11 10/13/2012   CREATININE 1.04 10/13/2012   GLUCOSE 85 10/13/2012   Lab Results  Component Value Date   INR 0.97 10/13/2012    Assessment:   Cervical neck pain with herniated nucleus pulposus/ spondylosis/ stenosis at c5-6 Patient has failed conservative therapy. Planned surgery : ACDF  Plan:   I explained the condition and procedure to the patient and answered any questions.  Patient wishes to proceed with procedure as planned. Understands risks/ benefits/ and expected or typical outcomes.  Antwion Carpenter S 10/20/2012 7:01 AM

## 2012-10-20 NOTE — Discharge Summary (Signed)
Physician Discharge Summary  Patient ID: Samuel Cabrera MRN: [id number] DOB/AGE: Apr 27, 1981 32 y.o.  Admit date: 10/20/2012 Discharge date: 10/20/2012  Admission Diagnoses: cervical disk herniation    Discharge Diagnoses: same   Discharged Condition: good  Hospital Course: The patient was admitted on 10/20/2012 and taken to the operating room where the patient underwent ACDF. The patient tolerated the procedure well and was taken to the recovery room and then to the floor in stable condition. The hospital course was routine. There were no complications. The wound remained clean dry and intact. Pt had appropriate neck soreness. No complaints of arm pain or new N/T/W. The patient remained afebrile with stable vital signs, and tolerated a regular diet. The patient continued to increase activities, and pain was well controlled with oral pain medications.   Consults: None  Significant Diagnostic Studies:  Results for orders placed during the hospital encounter of 10/13/12  SURGICAL PCR SCREEN      Result Value Range   MRSA, PCR POSITIVE (*) NEGATIVE   Staphylococcus aureus POSITIVE (*) NEGATIVE  BASIC METABOLIC PANEL      Result Value Range   Sodium 139  135 - 145 mEq/L   Potassium 4.3  3.5 - 5.1 mEq/L   Chloride 99  96 - 112 mEq/L   CO2 30  19 - 32 mEq/L   Glucose, Bld 85  70 - 99 mg/dL   BUN 11  6 - 23 mg/dL   Creatinine, Ser 4.09  0.50 - 1.35 mg/dL   Calcium 81.1  8.4 - 91.4 mg/dL   GFR calc non Af Amer >90  >90 mL/min   GFR calc Af Amer >90  >90 mL/min  CBC WITH DIFFERENTIAL      Result Value Range   WBC 6.0  4.0 - 10.5 K/uL   RBC 4.92  4.22 - 5.81 MIL/uL   Hemoglobin 15.7  13.0 - 17.0 g/dL   HCT 78.2  95.6 - 21.3 %   MCV 87.6  78.0 - 100.0 fL   MCH 31.9  26.0 - 34.0 pg   MCHC 36.4 (*) 30.0 - 36.0 g/dL   RDW 08.6  57.8 - 46.9 %   Platelets 250  150 - 400 K/uL   Neutrophils Relative 44  43 - 77 %   Neutro Abs 2.7  1.7 - 7.7 K/uL   Lymphocytes Relative 46  12 - 46 %    Lymphs Abs 2.7  0.7 - 4.0 K/uL   Monocytes Relative 9  3 - 12 %   Monocytes Absolute 0.5  0.1 - 1.0 K/uL   Eosinophils Relative 2  0 - 5 %   Eosinophils Absolute 0.1  0.0 - 0.7 K/uL   Basophils Relative 1  0 - 1 %   Basophils Absolute 0.0  0.0 - 0.1 K/uL  PROTIME-INR      Result Value Range   Prothrombin Time 12.8  11.6 - 15.2 seconds   INR 0.97  0.00 - 1.49    Chest 2 View  10/13/2012  *RADIOLOGY REPORT*  Clinical Data: Preoperative evaluation for cervical surgery on 02/14.  No current cardiac or pulmonary complaints.  Controlled hypertension.  Ex-smoker  CHEST - 2 VIEW  Comparison: None.  Findings: The lung fields are well-aerated.  Heart and mediastinal contours are within normal limits.  Mild central peribronchial cuffing and some mild increase in central interstitial markings correlate with underlying bronchitic change and the history of prior smoking.  The lung fields are otherwise clear with  no signs of focal infiltrate or congestive failure. No pleural fluid is noted. Incidental note is made of an azygos fissure.  Bony structures are intact  IMPRESSION: No worrisome focal or acute cardiopulmonary abnormality seen   Original Report Authenticated By: Rhodia Albright, M.D.    Dg Cervical Spine 2-3 Views  10/20/2012  *RADIOLOGY REPORT*  Clinical Data: Anterior fusion at C5-6  CERVICAL SPINE - 2-3 VIEW  Comparison: None.  Findings:  Two C-arm spot films were returned.  The lower cervical spine is not visualized.  There is an anterior cervical spine fusion plate at H4-7.  The C6-7 level cannot be evaluated.  IMPRESSION: Anterior fusion at C5-6.  C6-7 cannot be assessed.   Original Report Authenticated By: Dwyane Dee, M.D.    Dg C-arm 1-60 Min  10/20/2012  *RADIOLOGY REPORT*  Clinical Data: Anterior cervical spine fusion  Comparison:  None.  Findings: Two C-arm spot films were returned.  The lower cervical spine is not well visualized.  There does appear to be an anterior fusion plate present  at the C5-6 level.  The C6-7 level is not visualized.  IMPRESSION: Anterior fusion C5-6.  The C6-7 level cannot be assessed.   Original Report Authenticated By: Dwyane Dee, M.D.     Antibiotics:  Anti-infectives   Start     Dose/Rate Route Frequency Ordered Stop   10/20/12 2000  vancomycin (VANCOCIN) IVPB 1000 mg/200 mL premix     1,000 mg 200 mL/hr over 60 Minutes Intravenous  Once 10/20/12 1207     10/20/12 1215  ceFAZolin (ANCEF) IVPB 1 g/50 mL premix  Status:  Discontinued     1 g 100 mL/hr over 30 Minutes Intravenous Every 8 hours 10/20/12 1200 10/20/12 1206   10/20/12 0908  bacitracin 50,000 Units in sodium chloride irrigation 0.9 % 500 mL irrigation  Status:  Discontinued       As needed 10/20/12 0908 10/20/12 1022   10/20/12 0806  bacitracin 42595 UNITS injection    Comments:  FREEZE, PATRICIA: cabinet override      10/20/12 0806 10/20/12 2014   10/20/12 0623  vancomycin (VANCOCIN) 1 GM/200ML IVPB  Status:  Discontinued    Comments:  GALLMAN, KATHIE JEAN: cabinet override      10/20/12 0623 10/20/12 0623   10/20/12 0600  vancomycin (VANCOCIN) 1,500 mg in sodium chloride 0.9 % 500 mL IVPB     1,500 mg 250 mL/hr over 120 Minutes Intravenous On call to O.R. 10/19/12 1212 10/20/12 0810      Discharge Exam: Blood pressure 119/78, pulse 101, temperature 98.7 F (37.1 C), temperature source Oral, resp. rate 18, SpO2 92.00%. Neurologic: Grossly normal Incision ok  Discharge Medications:     Medication List    TAKE these medications       clonazePAM 1 MG tablet  Commonly known as:  KLONOPIN  Take 1 mg by mouth 2 (two) times daily as needed.     cloNIDine 0.1 MG tablet  Commonly known as:  CATAPRES  Take 0.1 mg by mouth daily.     cyclobenzaprine 10 MG tablet  Commonly known as:  FLEXERIL  Take 1 tablet (10 mg total) by mouth 3 (three) times daily as needed for muscle spasms.     DULoxetine 60 MG capsule  Commonly known as:  CYMBALTA  Take 60 mg by mouth daily before  breakfast.     oxyCODONE-acetaminophen 5-325 MG per tablet  Commonly known as:  PERCOCET/ROXICET  Take 1-2 tablets by mouth every 6 (  six) hours as needed for pain. For pain     ranitidine 150 MG tablet  Commonly known as:  ZANTAC  Take 150 mg by mouth 2 (two) times daily as needed.        Disposition: hoome   Final Dx: ACDF C5-6      Discharge Orders   Future Orders Complete By Expires     Call MD for:  difficulty breathing, headache or visual disturbances  As directed     Call MD for:  persistant nausea and vomiting  As directed     Call MD for:  redness, tenderness, or signs of infection (pain, swelling, redness, odor or green/yellow discharge around incision site)  As directed     Call MD for:  severe uncontrolled pain  As directed     Call MD for:  temperature >100.4  As directed     Diet - low sodium heart healthy  As directed     Discharge instructions  As directed     Comments:      No strenuous activity or heavy lifting    Driving Restrictions  As directed     Comments:      1 week    Increase activity slowly  As directed     Remove dressing in 48 hours  As directed        Follow-up Information   Follow up with Shazia Mitchener S, MD. Schedule an appointment as soon as possible for a visit in 2 weeks.   Contact information:   [address] [place] [address] [phone]        Signed: Tia Alert 10/20/2012, 4:00 PM

## 2012-10-20 NOTE — Anesthesia Procedure Notes (Signed)
Procedure Name: Intubation Date/Time: 10/20/2012 8:37 AM Performed by: Wray Kearns A Pre-anesthesia Checklist: Patient identified, Timeout performed, Emergency Drugs available, Suction available and Patient being monitored Patient Re-evaluated:Patient Re-evaluated prior to inductionOxygen Delivery Method: Circle system utilized Preoxygenation: Pre-oxygenation with 100% oxygen Intubation Type: IV induction and Cricoid Pressure applied Ventilation: Mask ventilation without difficulty Laryngoscope Size: Mac and 4 Grade View: Grade I Tube type: Oral Tube size: 8.0 mm Number of attempts: 1 Airway Equipment and Method: LTA kit utilized and Stylet Placement Confirmation: ETT inserted through vocal cords under direct vision,  breath sounds checked- equal and bilateral and positive ETCO2 Secured at: 24 cm Tube secured with: Tape Dental Injury: Teeth and Oropharynx as per pre-operative assessment  Comments: Pt neck kept in neutral position throughout Induction.

## 2012-10-25 ENCOUNTER — Encounter (HOSPITAL_COMMUNITY): Payer: Self-pay | Admitting: Neurological Surgery

## 2013-03-07 ENCOUNTER — Ambulatory Visit
Admission: RE | Admit: 2013-03-07 | Discharge: 2013-03-07 | Disposition: A | Discharge status: Home / Self Care | Payer: Managed Care, Other (non HMO) | Source: Ambulatory Visit | Attending: Neurological Surgery | Admitting: Neurological Surgery

## 2013-03-07 ENCOUNTER — Other Ambulatory Visit: Payer: Self-pay | Admitting: Neurological Surgery

## 2013-03-07 DIAGNOSIS — M549 Dorsalgia, unspecified: Secondary | ICD-10-CM

## 2013-03-12 ENCOUNTER — Other Ambulatory Visit: Payer: 59

## 2013-05-30 DIAGNOSIS — M51379 Other intervertebral disc degeneration, lumbosacral region without mention of lumbar back pain or lower extremity pain: Secondary | ICD-10-CM | POA: Insufficient documentation

## 2014-02-11 IMAGING — RF DG CERVICAL SPINE 2 OR 3 VIEWS
1 series · 2 of 2 positions shown · non-contrast
Comparison: None.

CLINICAL DATA: Anterior fusion at C5-6

CERVICAL SPINE - 2-3 VIEW

[Series 1: run · 2 of 2 slices shown]
[im 1/2]
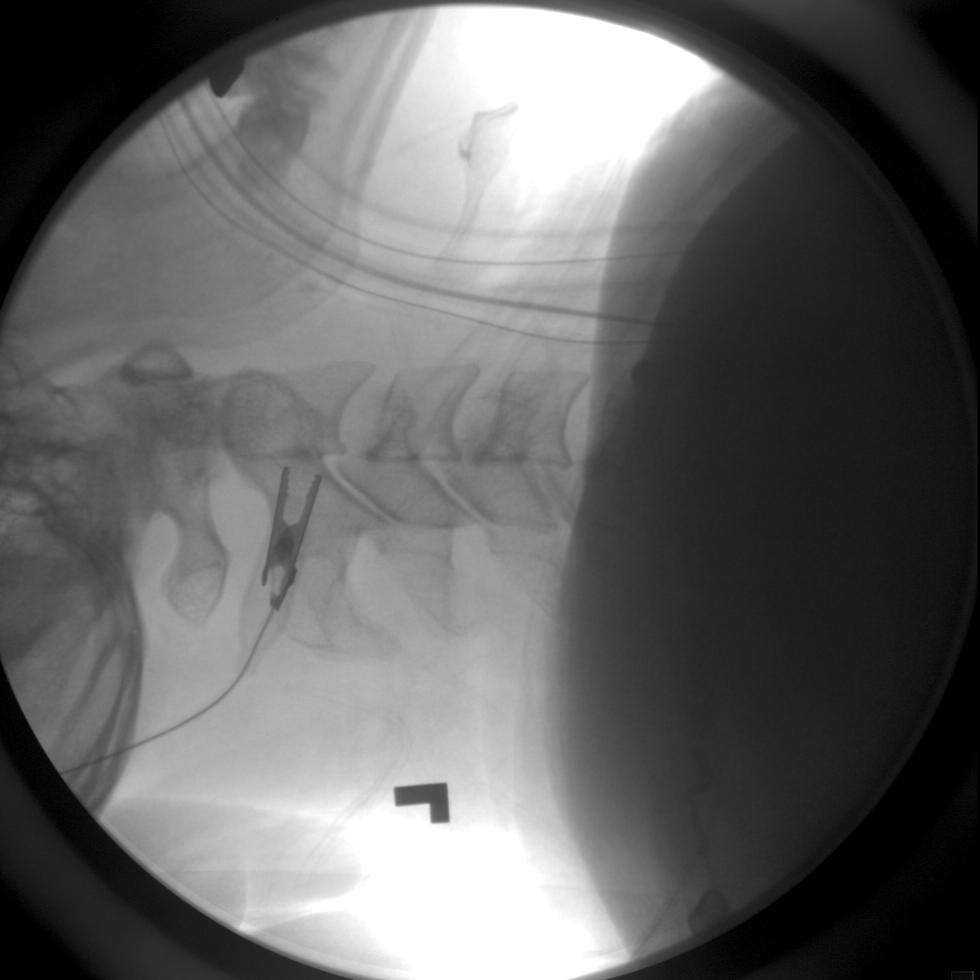
[im 2/2]
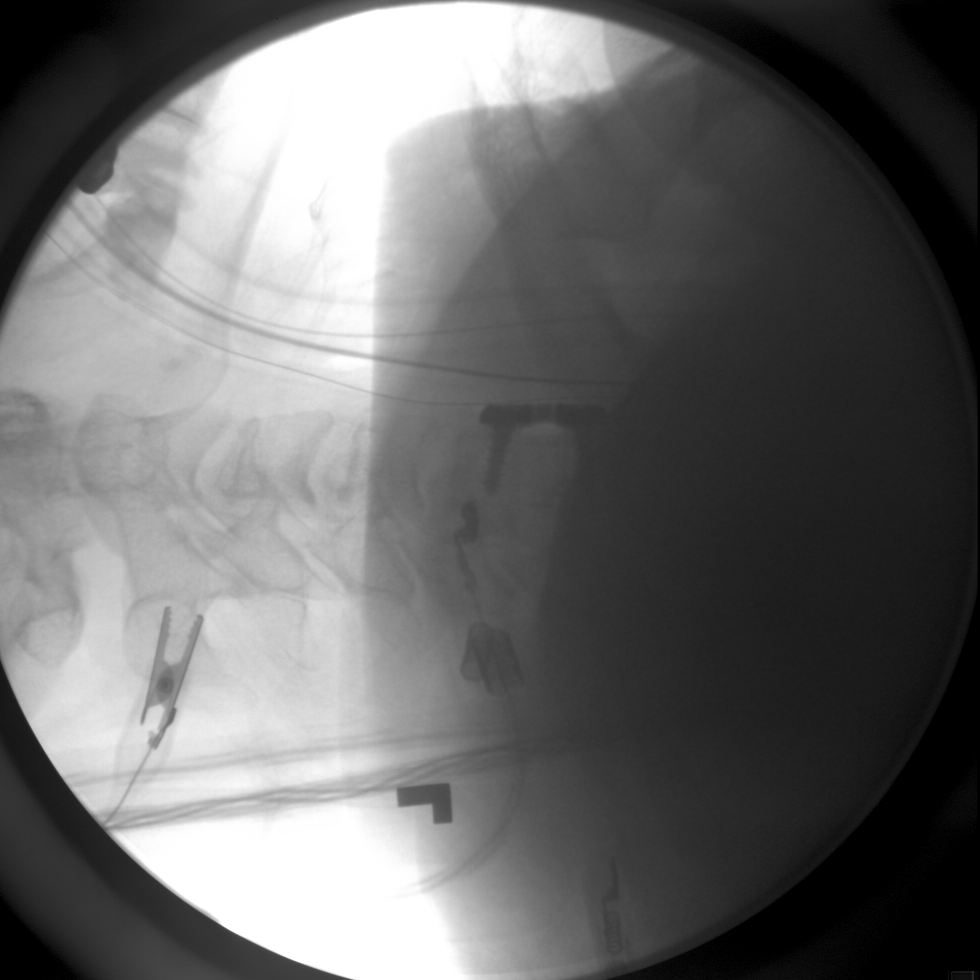

[2 of 2 positions shown; findings below may reference images not displayed]

FINDINGS: Two C-arm spot films were returned.  The lower cervical
spine is not visualized.  There is an anterior cervical spine
fusion plate at C5-6.  The C6-7 level cannot be evaluated.
IMPRESSION: Anterior fusion at C5-6.  C6-7 cannot be assessed.

## 2015-01-27 DIAGNOSIS — Z79899 Other long term (current) drug therapy: Secondary | ICD-10-CM | POA: Insufficient documentation

## 2015-01-27 DIAGNOSIS — Z87891 Personal history of nicotine dependence: Secondary | ICD-10-CM | POA: Insufficient documentation

## 2015-01-27 DIAGNOSIS — N200 Calculus of kidney: Secondary | ICD-10-CM | POA: Insufficient documentation

## 2015-01-27 DIAGNOSIS — I129 Hypertensive chronic kidney disease with stage 1 through stage 4 chronic kidney disease, or unspecified chronic kidney disease: Secondary | ICD-10-CM | POA: Insufficient documentation

## 2015-01-27 DIAGNOSIS — N189 Chronic kidney disease, unspecified: Secondary | ICD-10-CM | POA: Insufficient documentation

## 2015-01-27 DIAGNOSIS — Z880 Allergy status to penicillin: Secondary | ICD-10-CM | POA: Insufficient documentation

## 2015-01-27 LAB — CBC
HEMATOCRIT: 42.7 % (ref 40.0–52.0)
HEMOGLOBIN: 14.7 g/dL (ref 13.0–18.0)
MCH: 31.2 pg (ref 26.0–34.0)
MCHC: 34.4 g/dL (ref 32.0–36.0)
MCV: 90.5 fL (ref 80.0–100.0)
Platelets: 292 10*3/uL (ref 150–440)
RBC: 4.72 MIL/uL (ref 4.40–5.90)
RDW: 12.4 % (ref 11.5–14.5)
WBC: 9 10*3/uL (ref 3.8–10.6)

## 2015-01-27 LAB — COMPREHENSIVE METABOLIC PANEL
ALBUMIN: 4.6 g/dL (ref 3.5–5.0)
ALT: 13 U/L — ABNORMAL LOW (ref 17–63)
AST: 22 U/L (ref 15–41)
Alkaline Phosphatase: 77 U/L (ref 38–126)
Anion gap: 9 (ref 5–15)
BILIRUBIN TOTAL: 0.4 mg/dL (ref 0.3–1.2)
BUN: 10 mg/dL (ref 6–20)
CALCIUM: 9.4 mg/dL (ref 8.9–10.3)
CHLORIDE: 105 mmol/L (ref 101–111)
CO2: 25 mmol/L (ref 22–32)
CREATININE: 1.17 mg/dL (ref 0.61–1.24)
GFR calc Af Amer: >60 mL/min (ref >60)
GLUCOSE: 115 mg/dL — AB (ref 65–99)
Potassium: 3.2 mmol/L — ABNORMAL LOW (ref 3.5–5.1)
SODIUM: 139 mmol/L (ref 135–145)
TOTAL PROTEIN: 7.4 g/dL (ref 6.5–8.1)

## 2015-01-27 MED ORDER — SODIUM CHLORIDE 0.9 % IV BOLUS (SEPSIS)
1000.0000 mL | Freq: Once | INTRAVENOUS | Status: AC
  Administered 2015-01-27: 1000 mL via INTRAVENOUS

## 2015-01-27 MED ORDER — ONDANSETRON HCL 4 MG/2ML IJ SOLN
4.0000 mg | Freq: Once | INTRAMUSCULAR | Status: AC
  Administered 2015-01-27: 4 mg via INTRAVENOUS

## 2015-01-27 MED ORDER — KETOROLAC TROMETHAMINE 30 MG/ML IJ SOLN
INTRAMUSCULAR | Status: AC
  Administered 2015-01-27: 30 mg via INTRAVENOUS
  Filled 2015-01-27: qty 4

## 2015-01-27 MED ORDER — MORPHINE SULFATE 4 MG/ML IJ SOLN
INTRAMUSCULAR | Status: AC
  Administered 2015-01-27: 4 mg via INTRAVENOUS
  Filled 2015-01-27: qty 1

## 2015-01-27 MED ORDER — KETOROLAC TROMETHAMINE 30 MG/ML IJ SOLN
30.0000 mg | Freq: Once | INTRAMUSCULAR | Status: AC
  Administered 2015-01-27: 30 mg via INTRAVENOUS

## 2015-01-27 MED ORDER — MORPHINE SULFATE 4 MG/ML IJ SOLN
4.0000 mg | Freq: Once | INTRAMUSCULAR | Status: AC
  Administered 2015-01-27: 4 mg via INTRAVENOUS

## 2015-01-27 MED ORDER — ONDANSETRON HCL 4 MG/2ML IJ SOLN
INTRAMUSCULAR | Status: AC
  Administered 2015-01-27: 4 mg via INTRAVENOUS
  Filled 2015-01-27: qty 2

## 2015-01-27 NOTE — ED Notes (Addendum)
Patient present with c/o LEFT flank pain with (+) radiation into groin. PMH significant for kidney stones. Patient tearful in triage. Reports taking Oxycodone PTA. Patient unable to sit still... States, "The pain is going down into my testicle."

## 2015-01-28 ENCOUNTER — Emergency Department: Payer: Managed Care, Other (non HMO)

## 2015-01-28 ENCOUNTER — Emergency Department
Admission: EM | Admit: 2015-01-28 | Discharge: 2015-01-28 | Disposition: A | Discharge status: Home / Self Care | Payer: Self-pay | Attending: Emergency Medicine | Admitting: Emergency Medicine

## 2015-01-28 DIAGNOSIS — N200 Calculus of kidney: Secondary | ICD-10-CM

## 2015-01-28 LAB — URINALYSIS COMPLETE WITH MICROSCOPIC (ARMC ONLY)
BACTERIA UA: NONE SEEN
BILIRUBIN URINE: NEGATIVE
GLUCOSE, UA: NEGATIVE mg/dL
Ketones, ur: NEGATIVE mg/dL
Leukocytes, UA: NEGATIVE
NITRITE: NEGATIVE
Protein, ur: NEGATIVE mg/dL
SQUAMOUS EPITHELIAL / LPF: NONE SEEN
Specific Gravity, Urine: 1.009 (ref 1.005–1.030)
pH: 6 (ref 5.0–8.0)

## 2015-01-28 MED ORDER — HYDROMORPHONE HCL 1 MG/ML IJ SOLN
1.0000 mg | Freq: Once | INTRAMUSCULAR | Status: AC
  Administered 2015-01-28: 1 mg via INTRAVENOUS

## 2015-01-28 MED ORDER — MORPHINE SULFATE 4 MG/ML IJ SOLN
4.0000 mg | Freq: Once | INTRAMUSCULAR | Status: AC
  Administered 2015-01-28: 4 mg via INTRAVENOUS

## 2015-01-28 MED ORDER — HYDROMORPHONE HCL 1 MG/ML IJ SOLN
INTRAMUSCULAR | Status: AC
  Administered 2015-01-28: 1 mg via INTRAVENOUS
  Filled 2015-01-28: qty 1

## 2015-01-28 MED ORDER — MORPHINE SULFATE 4 MG/ML IJ SOLN
INTRAMUSCULAR | Status: AC
  Administered 2015-01-28: 4 mg via INTRAVENOUS
  Filled 2015-01-28: qty 1

## 2015-01-28 NOTE — ED Notes (Signed)
Patient transported to CT. Will give MD ordered Dilaudid 1mg  IVP when patient returns.

## 2015-01-28 NOTE — Discharge Instructions (Signed)
Kidney Stones °Kidney stones (urolithiasis) are deposits that form inside your kidneys. The intense pain is caused by the stone moving through the urinary tract. When the stone moves, the ureter goes into spasm around the stone. The stone is usually passed in the urine.  °CAUSES  °· A disorder that makes certain neck glands produce too much parathyroid hormone (primary hyperparathyroidism). °· A buildup of uric acid crystals, similar to gout in your joints. °· Narrowing (stricture) of the ureter. °· A kidney obstruction present at birth (congenital obstruction). °· Previous surgery on the kidney or ureters. °· Numerous kidney infections. °SYMPTOMS  °· Feeling sick to your stomach (nauseous). °· Throwing up (vomiting). °· Blood in the urine (hematuria). °· Pain that usually spreads (radiates) to the groin. °· Frequency or urgency of urination. °DIAGNOSIS  °· Taking a history and physical exam. °· Blood or urine tests. °· CT scan. °· Occasionally, an examination of the inside of the urinary bladder (cystoscopy) is performed. °TREATMENT  °· Observation. °· Increasing your fluid intake. °· Extracorporeal shock wave lithotripsy--This is a noninvasive procedure that uses shock waves to break up kidney stones. °· Surgery may be needed if you have severe pain or persistent obstruction. There are various surgical procedures. Most of the procedures are performed with the use of small instruments. Only small incisions are needed to accommodate these instruments, so recovery time is minimized. °The size, location, and chemical composition are all important variables that will determine the proper choice of action for you. Talk to your health care provider to better understand your situation so that you will minimize the risk of injury to yourself and your kidney.  °HOME CARE INSTRUCTIONS  °· Drink enough water and fluids to keep your urine clear or pale yellow. This will help you to pass the stone or stone fragments. °· Strain  all urine through the provided strainer. Keep all particulate matter and stones for your health care provider to see. The stone causing the pain may be as small as a grain of salt. It is very important to use the strainer each and every time you pass your urine. The collection of your stone will allow your health care provider to analyze it and verify that a stone has actually passed. The stone analysis will often identify what you can do to reduce the incidence of recurrences. °· Only take over-the-counter or prescription medicines for pain, discomfort, or fever as directed by your health care provider. °· Make a follow-up appointment with your health care provider as directed. °· Get follow-up X-rays if required. The absence of pain does not always mean that the stone has passed. It may have only stopped moving. If the urine remains completely obstructed, it can cause loss of kidney function or even complete destruction of the kidney. It is your responsibility to make sure X-rays and follow-ups are completed. Ultrasounds of the kidney can show blockages and the status of the kidney. Ultrasounds are not associated with any radiation and can be performed easily in a matter of minutes. °SEEK MEDICAL CARE IF: °· You experience pain that is progressive and unresponsive to any pain medicine you have been prescribed. °SEEK IMMEDIATE MEDICAL CARE IF:  °· Pain cannot be controlled with the prescribed medicine. °· You have a fever or shaking chills. °· The severity or intensity of pain increases over 18 hours and is not relieved by pain medicine. °· You develop a new onset of abdominal pain. °· You feel faint or pass out. °·   You are unable to urinate. MAKE SURE YOU:   Understand these instructions.  Will watch your condition.  Will get help right away if you are not doing well or get worse. Document Released: 08/23/2005 Document Revised: 04/25/2013 Document Reviewed: 01/24/2013 [facility] Patient Information 2015  [place], [place]. This information is not intended to replace advice given to you by your health care provider. Make sure you discuss any questions you have with your health care provider.  PATIENT RECEIVED IV MORPHINE AND DILAUDID IN THE EMERGENCY DEPARTMENT

## 2015-01-28 NOTE — ED Notes (Signed)
Brown,MD consulted. MD made aware of presenting complaints and triage assessment. MD with VORB for: Dilaudid 1mg  IVP. Orders to be entered and carried by this RN.

## 2015-01-28 NOTE — ED Notes (Signed)
Brown,MD consulted. MD made aware of presenting complaints and triage assessment. MD with VORB for: CT abd/pelvis to eval for stone. Order to be entered and carried by this RN.

## 2015-01-28 NOTE — ED Provider Notes (Signed)
[facility] Emergency Department Provider Note  ____________________________________________  Time seen: 3:00 AM   I have reviewed the triage vital signs and the nursing notes.   HISTORY  Chief Complaint Flank Pain      HPI Samuel Cabrera is a 34 y.o. male presents with acute onset of left flank pain timesone day. Patient denies nausea no vomiting no diarrhea no constipation of note patient admits to passing one kidney stone while at home. Current pain 7 out of 10     Past Medical History  Diagnosis Date  . GERD (gastroesophageal reflux disease)   . Anxiety   . Depression     medical treatment, states psych. gave him Clonidine in addition to other drugs for depression & ^BP  . Chronic kidney disease     lithotripsy- 2013  . Degenerative disc disease, lumbar     in lower back, hnp- cervical  . Hypertension     medical tretment for 2-21yrs, yet reports lack of compliance on & off. Pt. agrees to take now leading up to surgery    There are no active problems to display for this patient.   Past Surgical History  Procedure Laterality Date  . No past surgeries    . 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 Carpal tunnel release      both hands   . Anterior cervical decomp/discectomy fusion N/A 10/20/2012    Procedure: ANTERIOR CERVICAL DECOMPRESSION/DISCECTOMY FUSION 1 LEVEL;  Surgeon: Tia Alert, MD;  Location: [facility];  Service: Neurosurgery;  Laterality: N/A;    Current Outpatient Rx  Name  Route  Sig  Dispense  Refill  . clonazePAM (KLONOPIN) 1 MG tablet   Oral   Take 1 mg by mouth 2 (two) times daily as needed.          . cloNIDine (CATAPRES) 0.1 MG tablet   Oral   Take 0.1 mg by mouth daily.          . cyclobenzaprine (FLEXERIL) 10 MG tablet   Oral   Take 1 tablet (10 mg total) by mouth 3 (three) times daily as needed for muscle spasms.   90 tablet   1   . DULoxetine (CYMBALTA) 60 MG capsule  Oral   Take 60 mg by mouth daily before breakfast.          . oxyCODONE-acetaminophen (PERCOCET/ROXICET) 5-325 MG per tablet   Oral   Take 1-2 tablets by mouth every 6 (six) hours as needed for pain. For pain   120 tablet   0   . ranitidine (ZANTAC) 150 MG tablet   Oral   Take 150 mg by mouth 2 (two) times daily as needed.           Allergies Amoxicillin and Neurontin  No family history on file.  Social History History  Substance Use Topics  . Smoking status: Former Smoker -- 1.00 packs/day for 10 years    Types: Cigarettes    Quit date: 10/13/2009  . Smokeless tobacco: Former Neurosurgeon  . Alcohol Use: 1.2 oz/week    2 Cans of beer per week     Comment: daily    Review of Systems  Constitutional: Negative for fever. Eyes: Negative for visual changes. ENT: Negative for sore throat. Cardiovascular: Negative for chest pain. Respiratory: Negative for shortness of breath. Gastrointestinal: Positive for abdominal pain. vomiting and diarrhea. Genitourinary: Negative for dysuria. Musculoskeletal: Negative for  back pain. Skin: Negative for rash. Neurological: Negative for headaches, focal weakness or numbness.   10-point ROS otherwise negative.  ____________________________________________   PHYSICAL EXAM:  VITAL SIGNS: ED Triage Vitals  Enc Vitals Group     BP 01/27/15 2221 133/84 mmHg     Pulse Rate 01/27/15 2221 80     Resp 01/27/15 2221 18     Temp 01/27/15 2221 98.6 F (37 C)     Temp Source 01/27/15 2221 Oral     SpO2 01/27/15 2221 99 %     Weight --      Height --      Head Cir --      Peak Flow --      Pain Score 01/27/15 2217 10     Pain Loc --      Pain Edu? --      Excl. in GC? --      Constitutional: Alert and oriented. Apparent distress Eyes: Conjunctivae are normal. PERRL. Normal extraocular movements. ENT   Head: Normocephalic and atraumatic.   Nose: No congestion/rhinnorhea.   Mouth/Throat: Mucous membranes are moist.    Neck: No stridor. Cardiovascular: Normal rate, regular rhythm. Normal and symmetric distal pulses are present in all extremities. No murmurs, rubs, or gallops. Respiratory: Normal respiratory effort without tachypnea nor retractions. Breath sounds are clear and equal bilaterally. No wheezes/rales/rhonchi. Gastrointestinal: Soft and nontender. No distention. There is no CVA tenderness. Genitourinary: deferred Musculoskeletal: Nontender with normal range of motion in all extremities. No joint effusions.  No lower extremity tenderness nor edema. Neurologic:  Normal speech and language. No gross focal neurologic deficits are appreciated. Speech is normal.  Skin:  Skin is warm, dry and intact. No rash noted. Psychiatric: Mood and affect are normal. Speech and behavior are normal. Patient exhibits appropriate insight and judgment.  ____________________________________________    LABS (pertinent positives/negatives)  Labs Reviewed  COMPREHENSIVE METABOLIC PANEL - Abnormal; Notable for the following:    Potassium 3.2 (*)    Glucose, Bld 115 (*)    ALT 13 (*)    All other components within normal limits  URINALYSIS COMPLETEWITH MICROSCOPIC ([facility])  - Abnormal; Notable for the following:    Color, Urine YELLOW (*)    APPearance CLEAR (*)    Hgb urine dipstick 3+ (*)    All other components within normal limits  CBC         RADIOLOGY  CT scan revealed tWO kidney stones in the left ureter.  ____________________________________________      INITIAL IMPRESSION / ASSESSMENT AND PLAN / ED COURSE  Pertinent labs & imaging results that were available during my care of the patient were reviewed by me and considered in my medical decision making (see chart for details).  History physical exam and CT abdomen and pelvis consistent with left kidney stones patient received IV morphine and subsequently Dilaudid for analgesia in the emergency department. We'll refer patient to on-call urology  for outpatient follow-up  ____________________________________________   FINAL CLINICAL IMPRESSION(S) / ED DIAGNOSES  Final diagnoses:  Kidney stone on left side      Darci Currentandolph N Loralye Loberg, MD 01/28/15 [phone]

## 2015-01-28 NOTE — ED Notes (Signed)
Patient has asked several times during this visit about obtaining a paper MAR to take to his MD. Prior to administration of the MD ordered Dilaudid, patient once again asked for a list to be provided to him prior to discharge. MD made aware.

## 2015-01-28 NOTE — ED Notes (Signed)
Webster,MD consulted. MD made aware of presenting complaints and triage assessment. MD with VORB for: Morphine 4mg  IVP. Orders to be entered and carried by this RN.

## 2016-05-21 IMAGING — CT CT ABD-PELV W/O CM
1 of 2 series · 4 of 32 positions shown, 9 images · non-contrast
Comparison: CT abdomen and pelvis September 22, 2011

CLINICAL DATA: LEFT flank pain beginning early yesterday. History
of kidney stones and lithotripsy.

EXAM:
CT ABDOMEN AND PELVIS WITHOUT CONTRAST
TECHNIQUE: Multidetector CT imaging of the abdomen and pelvis was performed
following the standard protocol without IV contrast.

[Series 4: lung windows · axial · 0.73mm/px · z∈[-566,-506]mm · 4 of 21 slices shown, 9 images]
[im 5/21  soft-tissue]
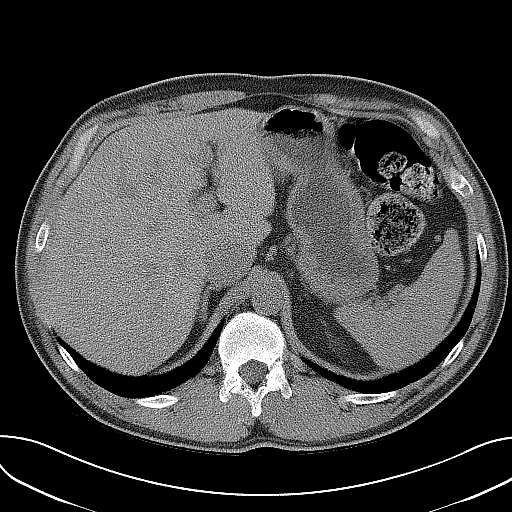
[im 5/21  lung]
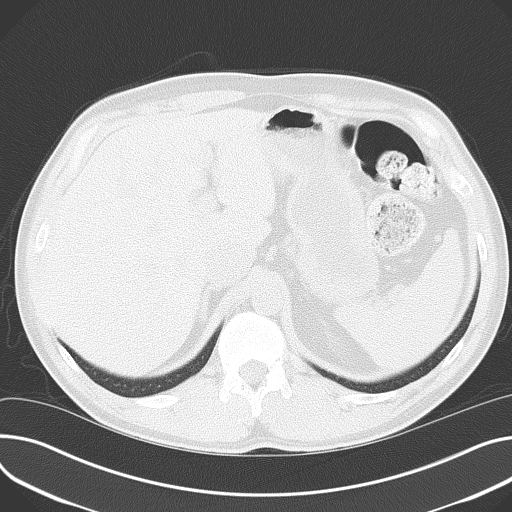
[im 5/21  bone]
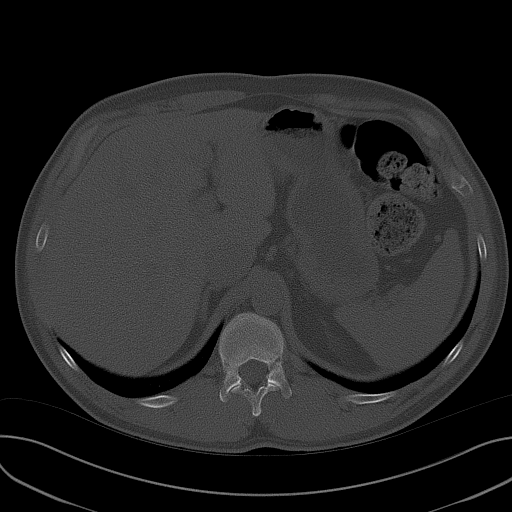
[im 9/21  soft-tissue]
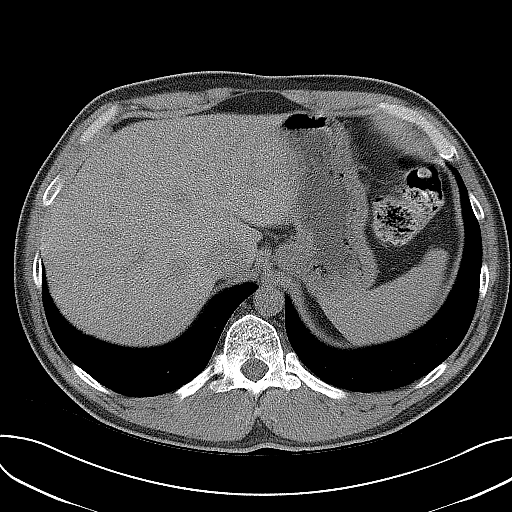
[im 9/21  lung]
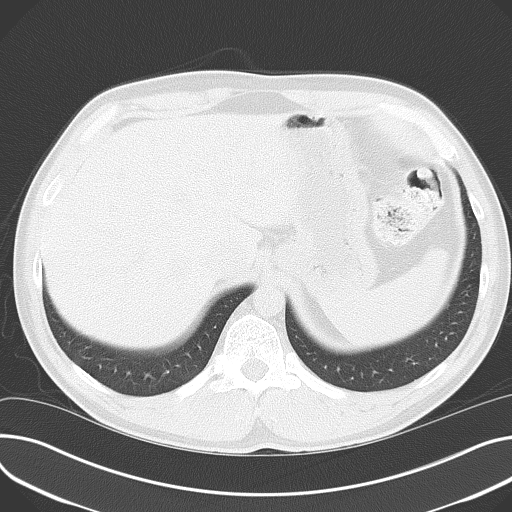
[im 13/21  soft-tissue]
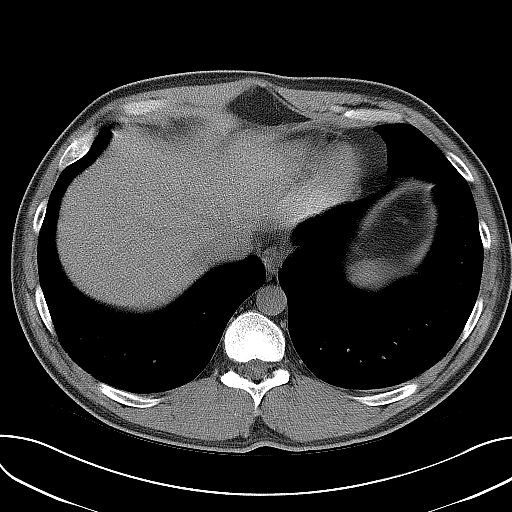
[im 13/21  lung]
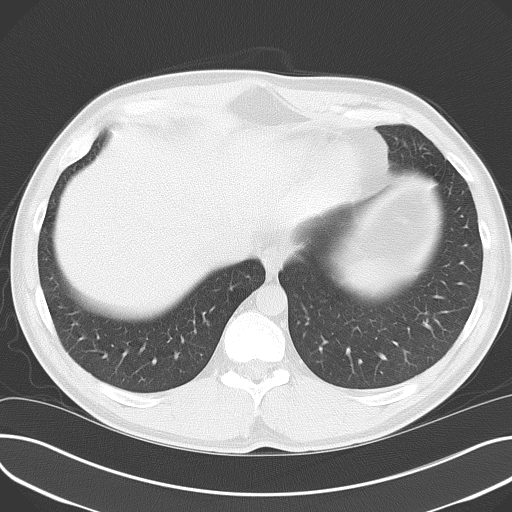
[im 17/21  soft-tissue]
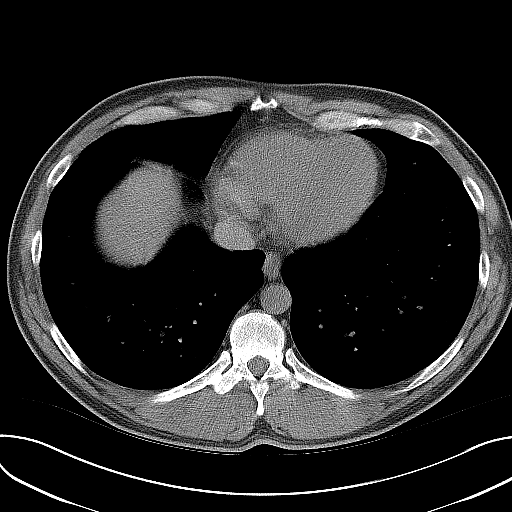
[im 17/21  lung]
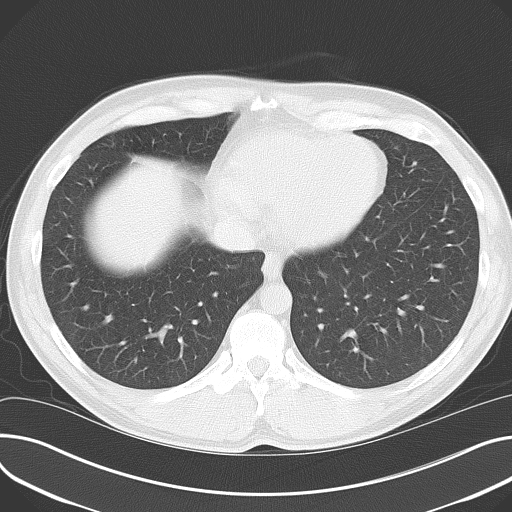

[4 of 32 positions shown; findings below may reference images not displayed]

FINDINGS: LUNG BASES: Included view of the lung bases are clear. The
visualized heart and pericardium are unremarkable.

KIDNEYS/BLADDER: Kidneys are orthotopic, demonstrating normal size
and morphology. Six small LEFT lower pole nephrolithiasis measure up
to 3 mm. Mild LEFT hydroureteronephrosis to the level of the
ureterovesicular junction where a 3 mm calculus is seen. In
addition, 4 mm calculus at LEFT ureterovesicular junction. LEFT
perinephric/ periureteral fat stranding. Mild RIGHT
hydroureteronephrosis without nephrolithiasis. Limited assessment
for renal masses on this nonenhanced examination. The urinary
bladder is well distended.

SOLID ORGANS: The liver, spleen, gallbladder, pancreas and adrenal
glands are unremarkable for this non-contrast examination.

GASTROINTESTINAL TRACT: The stomach, small and large bowel are
normal in course and caliber without inflammatory changes, the
sensitivity may be decreased by lack of enteric contrast. Normal
appendix.

PERITONEUM/RETROPERITONEUM: Aortoiliac vessels are normal in course
and caliber. No lymphadenopathy by CT size criteria. Prostate is
unremarkable. No intraperitoneal free fluid nor free air.

SOFT TISSUES/ OSSEOUS STRUCTURES: Nonsuspicious. Small fat
containing inguinal hernias. Transitional anatomy. Scattered chronic
Schmorl's nodes.
IMPRESSION: Mild LEFT hydroureteronephrosis, 3 mm distal ureter calculus, 4 mm
LEFT ureterovesicular junction calculus. LEFT peri-nephroureteral
fat stranding may be infectious or inflammatory. Recommend
correlation with urinary analysis.

6 small LEFT lower pole residual nephrolithiasis measure up to 3 mm.

Nonspecific mild RIGHT hydroureteronephrosis.

By: Hary Cristales

## 2017-04-03 ENCOUNTER — Emergency Department
Admission: EM | Admit: 2017-04-03 | Discharge: 2017-04-04 | Disposition: A | Discharge status: Home / Self Care | Payer: Managed Care, Other (non HMO) | Attending: Emergency Medicine | Admitting: Emergency Medicine

## 2017-04-03 DIAGNOSIS — F1110 Opioid abuse, uncomplicated: Secondary | ICD-10-CM

## 2017-04-03 DIAGNOSIS — Z87891 Personal history of nicotine dependence: Secondary | ICD-10-CM | POA: Diagnosis not present

## 2017-04-03 DIAGNOSIS — R45851 Suicidal ideations: Secondary | ICD-10-CM | POA: Insufficient documentation

## 2017-04-03 DIAGNOSIS — Z048 Encounter for examination and observation for other specified reasons: Secondary | ICD-10-CM | POA: Diagnosis present

## 2017-04-03 LAB — CBC
HCT: 46.2 % (ref 40.0–52.0)
HEMOGLOBIN: 16 g/dL (ref 13.0–18.0)
MCH: 31.2 pg (ref 26.0–34.0)
MCHC: 34.6 g/dL (ref 32.0–36.0)
MCV: 90.2 fL (ref 80.0–100.0)
Platelets: 293 10*3/uL (ref 150–440)
RBC: 5.12 MIL/uL (ref 4.40–5.90)
RDW: 13.1 % (ref 11.5–14.5)
WBC: 8.3 10*3/uL (ref 3.8–10.6)

## 2017-04-03 LAB — URINALYSIS, COMPLETE (UACMP) WITH MICROSCOPIC
BACTERIA UA: NONE SEEN
Bilirubin Urine: NEGATIVE
Glucose, UA: NEGATIVE mg/dL
Ketones, ur: NEGATIVE mg/dL
Leukocytes, UA: NEGATIVE
Nitrite: NEGATIVE
Protein, ur: NEGATIVE mg/dL
SPECIFIC GRAVITY, URINE: 1.011 (ref 1.005–1.030)
pH: 6 (ref 5.0–8.0)

## 2017-04-03 LAB — COMPREHENSIVE METABOLIC PANEL
ALBUMIN: 5 g/dL (ref 3.5–5.0)
ALK PHOS: 84 U/L (ref 38–126)
ALT: 22 U/L (ref 17–63)
AST: 21 U/L (ref 15–41)
Anion gap: 9 (ref 5–15)
BUN: 12 mg/dL (ref 6–20)
CALCIUM: 9.7 mg/dL (ref 8.9–10.3)
CO2: 25 mmol/L (ref 22–32)
CREATININE: 0.98 mg/dL (ref 0.61–1.24)
Chloride: 108 mmol/L (ref 101–111)
GFR calc non Af Amer: >60 mL/min (ref >60)
GLUCOSE: 101 mg/dL — AB (ref 65–99)
Potassium: 3.5 mmol/L (ref 3.5–5.1)
Sodium: 142 mmol/L (ref 135–145)
Total Bilirubin: 0.7 mg/dL (ref 0.3–1.2)
Total Protein: 8.1 g/dL (ref 6.5–8.1)

## 2017-04-03 MED ORDER — NICOTINE 21 MG/24HR TD PT24
21.0000 mg | MEDICATED_PATCH | Freq: Once | TRANSDERMAL | Status: DC
  Administered 2017-04-03: 21 mg via TRANSDERMAL
  Filled 2017-04-03: qty 1

## 2017-04-03 NOTE — ED Notes (Signed)
Pt's belongings placed in bag by this RN. Pt has wallet which he states has no cash in it, a lighter and pack of cigarettes with 1 missing.

## 2017-04-03 NOTE — ED Provider Notes (Signed)
[facility] Emergency Department Provider Note       Time seen: ----------------------------------------- 4:28 PM on 04/03/2017 -----------------------------------------     I have reviewed the triage vital signs and the nursing notes.   HISTORY   Chief Complaint No chief complaint on file.    HPI Samuel MountsCharles R Cabrera is a 36 y.o. male who presents to the ED for involuntary commitment. 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 Patient states he decided to leave rehabilitation early and informed his wife that he was coming home. Wife called the police stating that he was having suicidal thoughts. Patient denies ever having this. Patient denies any medical complaints. Wife states he has been making suicidal threats.   Past Medical History:  Diagnosis Date  . Anxiety   . Chronic kidney disease    lithotripsy- 2013  . Degenerative disc disease, lumbar    in lower back, hnp- cervical  . Depression    medical treatment, states psych. gave him Clonidine in addition to other drugs for depression & ^BP  . GERD (gastroesophageal reflux disease)   . Hypertension    medical tretment for 2-7865yrs, yet reports lack of compliance on & off. Pt. agrees to take now leading up to surgery    There are no active problems to display for this patient.   Past Surgical History:  Procedure Laterality Date  . ANTERIOR CERVICAL DECOMP/DISCECTOMY FUSION N/A 10/20/2012   Procedure: ANTERIOR CERVICAL DECOMPRESSION/DISCECTOMY FUSION 1 LEVEL;  Surgeon: Tia Alertavid S Jones, MD;  Location: [facility];  Service: Neurosurgery;  Laterality: N/A;  . CARPAL TUNNEL RELEASE     both hands   . 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 NO PAST SURGERIES      Allergies Amoxicillin and Neurontin [gabapentin]  Social History Social History  Substance Use Topics  . Smoking status: Former Smoker    Packs/day: 1.00    Years: 10.00    Types:  Cigarettes    Quit date: 10/13/2009  . Smokeless tobacco: Former NeurosurgeonUser  . Alcohol use 1.2 oz/week    2 Cans of beer per week     Comment: daily    Review of Systems Constitutional: Negative for fever. Cardiovascular: Negative for chest pain. Respiratory: Negative for shortness of breath. Gastrointestinal: Negative for abdominal pain, vomiting and diarrhea. Genitourinary: Negative for dysuria. Musculoskeletal: Negative for back pain. Skin: Negative for rash. Neurological: Negative for headaches, focal weakness or numbness. Psychiatric: Negative for suicidal or homicidal ideation  All systems negative/normal/unremarkable except as stated in the HPI  ____________________________________________   PHYSICAL EXAM:  VITAL SIGNS: ED Triage Vitals  Enc Vitals Group     BP 04/03/17 1618 (!) 128/95     Pulse Rate 04/03/17 1618 98     Resp 04/03/17 1618 20     Temp 04/03/17 1618 98.1 F (36.7 C)     Temp Source 04/03/17 1618 Oral     SpO2 04/03/17 1618 99 %     Weight 04/03/17 1445 203 lb (92.1 kg)     Height 04/03/17 1445 6' (1.829 m)     Head Circumference --      Peak Flow --      Pain Score 04/03/17 1445 0     Pain Loc --      Pain Edu? --      Excl. in GC? --     Constitutional: Alert and oriented. Well appearing and in no distress.  Eyes: Conjunctivae are normal. Normal extraocular movements. ENT   Head: Normocephalic and atraumatic.   Nose: No congestion/rhinnorhea.   Mouth/Throat: Mucous membranes are moist.   Neck: No stridor. Cardiovascular: Normal rate, regular rhythm. No murmurs, rubs, or gallops. Respiratory: Normal respiratory effort without tachypnea nor retractions. Breath sounds are clear and equal bilaterally. No wheezes/rales/rhonchi. Gastrointestinal: Soft and nontender. Normal bowel sounds Musculoskeletal: Nontender with normal range of motion in extremities. No lower extremity tenderness nor edema. Neurologic:  Normal speech and language. No  gross focal neurologic deficits are appreciated.  Skin:  Skin is warm, dry and intact. No rash noted. Psychiatric: Mood and affect are normal. Speech and behavior are normal.  ____________________________________________  ED COURSE:  Pertinent labs & imaging results that were available during my care of the patient were reviewed by me and considered in my medical decision making (see chart for details). Patient presents for Involuntary commitment, we will assess with labs and discussed with family as indicated.   Procedures ____________________________________________   LABS (pertinent positives/negatives)  Labs Reviewed  COMPREHENSIVE METABOLIC PANEL - Abnormal; Notable for the following:       Result Value   Glucose, Bld 101 (*)    All other components within normal limits  URINALYSIS, COMPLETE (UACMP) WITH MICROSCOPIC - Abnormal; Notable for the following:    Color, Urine YELLOW (*)    APPearance CLEAR (*)    Hgb urine dipstick MODERATE (*)    Squamous Epithelial / LPF 0-5 (*)    All other components within normal limits  CBC   ____________________________________________  FINAL ASSESSMENT AND PLAN  Narcotic addiction, Suicidal ideation  Plan: Patient's labs were dictated above. Patient had presented for making suicidal threats to his wife. He has abruptly left the detox facility and would benefit from observation and psychiatric evaluation tomorrow.   Emily FilbertWilliams, Ragna Kramlich E, MD   Note: This note was generated in part or whole with voice recognition software. Voice recognition is usually quite accurate but there are transcription errors that can and very often do occur. I apologize for any typographical errors that were not detected and corrected.     Emily FilbertWilliams, Flem Enderle E, MD 04/03/17 [phone]

## 2017-04-03 NOTE — ED Notes (Signed)

## 2017-04-03 NOTE — ED Triage Notes (Signed)
FIRST NURSE NOTE-brought by sheriff. In chairs in triage area to wait for triage. Is IVC

## 2017-04-03 NOTE — ED Notes (Signed)
PT IVC PENDING PSYCH CONSULT. 

## 2017-04-03 NOTE — ED Notes (Signed)
Pt. Alert and oriented, warm and dry, in no distress. Pt. Denies SI, HI, and AVH. Pt. Encouraged to let nursing staff know of any concerns or needs. 

## 2017-04-03 NOTE — ED Triage Notes (Signed)
Pt to ED by [facility] with IVC. Pt recently went to rehab for oxycodone addiction. Pt states he decided to leave rehab early and informed his wife that he was coming home. Wife called police stating that the pt was having SI. Pt denies ever having SI or HI.

## 2017-04-03 NOTE — ED Notes (Signed)
Report received from Samuel Cabrera, Charity fundraiserN. Pt dressed in purple scrubs. Pt orient to unit. Pt verbalized understanding of unit rules. Pt offer snack and drank. Refuse offer. Pt denies pain. Pt denies ah/vh/si/hi at this time. No other issue voiced/reported. Remains on Q 15 mins safety check. Will cont to monitor pt.

## 2017-04-03 NOTE — ED Notes (Signed)
Pt changed into Beh Scrubs with this RN and [facility] present. Clothing placed in bag with below mentioned personal belongings.

## 2017-04-04 DIAGNOSIS — F1110 Opioid abuse, uncomplicated: Secondary | ICD-10-CM

## 2017-04-04 NOTE — ED Provider Notes (Signed)
-----------------------------------------   1:13 PM on 04/04/2017 -----------------------------------------   Blood pressure 124/83, pulse 84, temperature 98.3 F (36.8 C), temperature source Oral, resp. rate 16, height 6' (1.829 m), weight 92.1 kg (203 lb), SpO2 100 %.  The patient had no acute events since last update.  Calm and cooperative at this time.   Patient either by Dr. Toni Amendlapacs and has been deemed appropriate for discharge to home. The patient is not expressing any suicidal or homicidal ideation. The patient will be referred to [facility] .   Schaevitz, Myra Rudeavid Matthew, MD 04/04/17 1314

## 2017-04-04 NOTE — Consult Note (Signed)
Samuel Cabrera Psychiatry Consult   Reason for Consult:  Consult for 36 year old man brought here under commitment filed by his wife alleging suicidality Referring Physician:  Event organiser Patient Identification: Samuel Cabrera MRN:  [id number] Principal Diagnosis: Opiate abuse, continuous Diagnosis:   Patient Active Problem List   Diagnosis Date Noted  . Opiate abuse, continuous [F11.10] 04/04/2017    Total Time spent with patient: 1 hour  Subjective:   Samuel Cabrera is a 36 y.o. male patient admitted with "I really have no reason to be here".  HPI:  Patient interviewed chart reviewed. 36 year old man brought in under petition. Wife filed commitment papers saying that the patient had made threats to kill himself. Patient denies this. Denies ever saying anything about being suicidal. Denies any feelings of depression. He admits that he hasn't opiate abuse problem. At the beginning of July he went to [place] to attend a rehabilitation program. Wilburn Mylar he impulsively decided to leave there. He felt like he had gotten as much out of it as he could. Evidently his wife was upset about his decision to leave and told him not to come home. As a result patient has been staying or at least intended to stay with a friend of his. He denies that he has relapsed into drug use yet. Denies any intention to do so. Patient presents as calm well-groomed appropriate in his interaction and does not show any obvious signs of depression or disorganized thinking.  Substance abuse history: History of opiate abuse for the last couple years that he says grew out of prescriptions for back pain. Denies that he was using IV drugs. Has been using just opiates denies regular alcohol abuse denies other drug use.  Medical history: Chronic back pain otherwise no significant medical problems  Social history: Patient says that his wife has told him not to come home. He has 3 children back there. Works doing Dentist work.  Past Psychiatric History: Patient denies any past psychiatric history other than having gone to rehabilitation for substance abuse. No history of suicide attempts no history of violence no history of treatment for depression or mental health problems in the past.  Risk to Self: Is patient at risk for suicide?: No Risk to Others:   Prior Inpatient Therapy:   Prior Outpatient Therapy:    Past Medical History:  Past Medical History:  Diagnosis Date  . Anxiety   . Chronic kidney disease    lithotripsy- 2013  . Degenerative disc disease, lumbar    in lower back, hnp- cervical  . Depression    medical treatment, states psych. gave him Clonidine in addition to other drugs for depression & ^BP  . GERD (gastroesophageal reflux disease)   . Hypertension    medical tretment for 2-74yr, yet reports lack of compliance on & off. Pt. agrees to take now leading up to surgery    Past Surgical History:  Procedure Laterality Date  . ANTERIOR CERVICAL DECOMP/DISCECTOMY FUSION N/A 10/20/2012   Procedure: ANTERIOR CERVICAL DECOMPRESSION/DISCECTOMY FUSION 1 LEVEL;  Surgeon: DEustace Moore MD;  [facility];  Service: Neurosurgery;  Laterality: N/A;  . CARPAL TUNNEL RELEASE     both hands   . 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 NO PAST SURGERIES     Family History: No family history on file. Family Psychiatric  History: Denies any family history Social History:  History  Alcohol Use  .  1.2 oz/week  . 2 Cans of beer per week    Comment: daily     History  Drug Use No    Social History   Social History  . Marital status: Married    Spouse name: N/A  . Number of children: N/A  . Years of education: N/A   Social History Main Topics  . Smoking status: Former Smoker    Packs/day: 1.00    Years: 10.00    Types: Cigarettes    Quit date: 10/13/2009  . Smokeless tobacco: Former Systems developer  . Alcohol use 1.2 oz/week    2 Cans of beer per week      Comment: daily  . Drug use: No  . Sexual activity: Not on file     Comment: did not ask   Other Topics Concern  . Not on file   Social History Narrative  . No narrative on file   Additional Social History:    Allergies:   Allergies  Allergen Reactions  . Amoxicillin Anaphylaxis  . Neurontin [Gabapentin] Other (See Comments)    hallucinations    Labs:  Results for orders placed or performed during the hospital encounter of 04/03/17 (from the past 48 hour(s))  CBC     Status: None   Collection Time: 04/03/17  2:54 PM  Result Value Ref Range   WBC 8.3 3.8 - 10.6 K/uL   RBC 5.12 4.40 - 5.90 MIL/uL   Hemoglobin 16.0 13.0 - 18.0 g/dL   HCT 46.2 40.0 - 52.0 %   MCV 90.2 80.0 - 100.0 fL   MCH 31.2 26.0 - 34.0 pg   MCHC 34.6 32.0 - 36.0 g/dL   RDW 13.1 11.5 - 14.5 %   Platelets 293 150 - 440 K/uL  Comprehensive metabolic panel     Status: Abnormal   Collection Time: 04/03/17  2:54 PM  Result Value Ref Range   Sodium 142 135 - 145 mmol/L   Potassium 3.5 3.5 - 5.1 mmol/L   Chloride 108 101 - 111 mmol/L   CO2 25 22 - 32 mmol/L   Glucose, Bld 101 (H) 65 - 99 mg/dL   BUN 12 6 - 20 mg/dL   Creatinine, Ser 0.98 0.61 - 1.24 mg/dL   Calcium 9.7 8.9 - 10.3 mg/dL   Total Protein 8.1 6.5 - 8.1 g/dL   Albumin 5.0 3.5 - 5.0 g/dL   AST 21 15 - 41 U/L   ALT 22 17 - 63 U/L   Alkaline Phosphatase 84 38 - 126 U/L   Total Bilirubin 0.7 0.3 - 1.2 mg/dL   GFR calc non Af Amer >60 >60 mL/min   GFR calc Af Amer >60 >60 mL/min    Comment: (NOTE) The eGFR has been calculated using the CKD EPI equation. This calculation has not been validated in all clinical situations. eGFR's persistently <60 mL/min signify possible Chronic Kidney Disease.    Anion gap 9 5 - 15  Urinalysis, Complete w Microscopic     Status: Abnormal   Collection Time: 04/03/17  2:55 PM  Result Value Ref Range   Color, Urine YELLOW (A) YELLOW   APPearance CLEAR (A) CLEAR   Specific Gravity, Urine 1.011 1.005 -  1.030   pH 6.0 5.0 - 8.0   Glucose, UA NEGATIVE NEGATIVE mg/dL   Hgb urine dipstick MODERATE (A) NEGATIVE   Bilirubin Urine NEGATIVE NEGATIVE   Ketones, ur NEGATIVE NEGATIVE mg/dL   Protein, ur NEGATIVE NEGATIVE mg/dL   Nitrite NEGATIVE NEGATIVE  Leukocytes, UA NEGATIVE NEGATIVE   RBC / HPF 0-5 0 - 5 RBC/hpf   WBC, UA 0-5 0 - 5 WBC/hpf   Bacteria, UA NONE SEEN NONE SEEN   Squamous Epithelial / LPF 0-5 (A) NONE SEEN    No current facility-administered medications for this encounter.    Current Outpatient Prescriptions  Medication Sig Dispense Refill  . baclofen (LIORESAL) 10 MG tablet Take 10 mg by mouth 3 (three) times daily as needed.  0  . celecoxib (CELEBREX) 100 MG capsule Take 100 mg by mouth 2 (two) times daily.   0  . diazepam (VALIUM) 10 MG tablet Take 10 mg by mouth daily.    Marland Kitchen doxepin (SINEQUAN) 50 MG capsule Take 50 mg by mouth daily.  0  . hydrOXYzine (VISTARIL) 50 MG capsule Take 50 mg by mouth every 6 (six) hours as needed.   0  . ibuprofen (ADVIL,MOTRIN) 600 MG tablet Take 600 mg by mouth every 6 (six) hours as needed for pain.  0  . Melatonin 5 MG TABS Take 5 mg by mouth at bedtime.  0  . cyclobenzaprine (FLEXERIL) 10 MG tablet Take 1 tablet (10 mg total) by mouth 3 (three) times daily as needed for muscle spasms. (Patient not taking: Reported on 04/04/2017) 90 tablet 1  . oxyCODONE-acetaminophen (PERCOCET/ROXICET) 5-325 MG per tablet Take 1-2 tablets by mouth every 6 (six) hours as needed for pain. For pain (Patient not taking: Reported on 04/04/2017) 120 tablet 0    Musculoskeletal: Strength & Muscle Tone: within normal limits Gait & Station: normal Patient leans: N/A  Psychiatric Specialty Exam: Physical Exam  Nursing note and vitals reviewed. Constitutional: He appears well-developed and well-nourished.  HENT:  Head: Normocephalic and atraumatic.  Eyes: Pupils are equal, round, and reactive to light. Conjunctivae are normal.  Neck: Normal range of motion.   Cardiovascular: Regular rhythm and normal heart sounds.   Respiratory: Effort normal. No respiratory distress.  GI: Soft.  Musculoskeletal: Normal range of motion.  Neurological: He is alert.  Skin: Skin is warm and dry.  Psychiatric: He has a normal mood and affect. His behavior is normal. Judgment and thought content normal.    Review of Systems  Constitutional: Negative.   HENT: Negative.   Eyes: Negative.   Respiratory: Negative.   Cardiovascular: Negative.   Gastrointestinal: Negative.   Musculoskeletal: Negative.   Skin: Negative.   Neurological: Negative.   Psychiatric/Behavioral: Negative for depression, hallucinations, memory loss, substance abuse and suicidal ideas. The patient is not nervous/anxious and does not have insomnia.     Blood pressure 124/83, pulse 84, temperature 98.3 F (36.8 C), temperature source Oral, resp. rate 16, height 6' (1.829 m), weight 203 lb (92.1 kg), SpO2 100 %.Body mass index is 27.53 kg/m.  General Appearance: Fairly Groomed  Eye Contact:  Good  Speech:  Clear and Coherent  Volume:  Normal  Mood:  Euthymic  Affect:  Congruent  Thought Process:  Goal Directed  Orientation:  Full (Time, Place, and Person)  Thought Content:  Logical  Suicidal Thoughts:  No  Homicidal Thoughts:  No  Memory:  Immediate;   Good Recent;   Fair Remote;   Fair  Judgement:  Fair  Insight:  Fair  Psychomotor Activity:  Normal  Concentration:  Concentration: Fair  Recall:  [employer] of Knowledge:  Fair  Language:  Fair  Akathisia:  No  Handed:  Right  AIMS (if indicated):     Assets:  Armed forces logistics/support/administrative officer Housing Physical  Health  ADL's:  Intact  Cognition:  WNL  Sleep:        Treatment Plan Summary: Plan 36 year old man with a history of opiate abuse. Ever since being in the emergency room he has consistently denied any suicidal thought at all. He does not appear to be obviously depressed or in any distress. Patient is calm and appropriate in his  interactions. Does not appear to meet commitment criteria. Patient states he intends to continue going to outpatient substance abuse treatment and staying off of drugs. No indicate to start any medicine. Case reviewed with the ER physician and TTS. Patient denied permission to speak with his wife or family. He can be discharged to local resources for substance abuse treatment.  Disposition: Patient does not meet criteria for psychiatric inpatient admission. Supportive therapy provided about ongoing stressors.  Alethia Berthold, MD 04/04/2017 6:15 PM

## 2017-04-04 NOTE — ED Notes (Signed)
Patient is awake, denies Si/hi or avh, states that he stopped taking pain pills July 6th 2018 [place], and left early and when returned His wife and his family upset, and His wife would not let him come home. Patient states that He was upset about not being able to go home, but went to a friends house, and His wife called police and lied about him being suicidal. Patient states " I have to much to live for, and never would harm myself. Patient is cooperative and calm, and is safe. q 15 minute checks and camera surveillance in progress for safety.

## 2017-04-04 NOTE — ED Notes (Signed)
Patient ate 100% of breakfast and beverage, patient is calm and cooperative, no signs of distress, he is safe, denies Si/hi or avh.

## 2017-04-04 NOTE — ED Provider Notes (Signed)
-----------------------------------------   12:12 AM on 04/04/2017 -----------------------------------------   Blood pressure 130/84, pulse 71, temperature 98.3 F (36.8 C), temperature source Oral, resp. rate 18, height 6' (1.829 m), weight 92.1 kg (203 lb), SpO2 99 %.  The patient had no acute events since last update.  Calm and cooperative at this time.  Disposition is pending Psychiatry/Behavioral Medicine team recommendations.     Merrily Brittleifenbark, Niccolas Loeper, MD 04/04/17 [phone]

## 2017-04-04 NOTE — ED Notes (Signed)
Patient is alert and oriented, no signs of distress, patient voices understanding of discharge instructions, all belongings given back to Patient. Patient discharged to lobby and he called family member to transport home.

## 2019-04-12 ENCOUNTER — Ambulatory Visit: Payer: Managed Care, Other (non HMO) | Admitting: Primary Care

## 2019-04-24 ENCOUNTER — Ambulatory Visit: Payer: 59 | Admitting: Primary Care

## 2019-04-24 ENCOUNTER — Other Ambulatory Visit: Payer: Self-pay

## 2019-04-24 ENCOUNTER — Encounter: Payer: Self-pay | Admitting: Primary Care

## 2019-04-24 VITALS — BP 166/100 | HR 78 | Temp 98.1°F | Ht 70.25 in | Wt 232.5 lb

## 2019-04-24 DIAGNOSIS — F329 Major depressive disorder, single episode, unspecified: Secondary | ICD-10-CM | POA: Diagnosis not present

## 2019-04-24 DIAGNOSIS — I10 Essential (primary) hypertension: Secondary | ICD-10-CM

## 2019-04-24 LAB — COMPREHENSIVE METABOLIC PANEL
ALT: 14 U/L (ref 0–53)
AST: 14 U/L (ref 0–37)
Albumin: 4.5 g/dL (ref 3.5–5.2)
Alkaline Phosphatase: 81 U/L (ref 39–117)
BUN: 10 mg/dL (ref 6–23)
CO2: 29 mEq/L (ref 19–32)
Calcium: 9.7 mg/dL (ref 8.4–10.5)
Chloride: 104 mEq/L (ref 96–112)
Creatinine, Ser: 0.84 mg/dL (ref 0.40–1.50)
GFR: 101.7 mL/min (ref >60.00)
Glucose, Bld: 96 mg/dL (ref 70–99)
Potassium: 4.2 mEq/L (ref 3.5–5.1)
Sodium: 140 mEq/L (ref 135–145)
Total Bilirubin: 0.4 mg/dL (ref 0.2–1.2)
Total Protein: 6.8 g/dL (ref 6.0–8.3)

## 2019-04-24 LAB — LIPID PANEL
Cholesterol: 232 mg/dL — ABNORMAL HIGH (ref 0–200)
HDL: 36.9 mg/dL — ABNORMAL LOW (ref >39.00)
LDL Cholesterol: 156 mg/dL — ABNORMAL HIGH (ref 0–99)
NonHDL: 194.72
Total CHOL/HDL Ratio: 6
Triglycerides: 194 mg/dL — ABNORMAL HIGH (ref 0.0–149.0)
VLDL: 38.8 mg/dL (ref 0.0–40.0)

## 2019-04-24 MED ORDER — LISINOPRIL-HYDROCHLOROTHIAZIDE 20-12.5 MG PO TABS: 1.0000 | ORAL_TABLET | Freq: Every day | ORAL | 0 refills | Status: DC

## 2019-04-24 NOTE — Progress Notes (Signed)
Subjective:    Patient ID: Samuel Cabrera, male    DOB: December 01, 1980, 38 y.o.   MRN: [id number]  HPI  [name] is a 38 year old male who presents today to establish care and discuss the problems mentioned below. Will obtain/review records.  1) History of Opiate Abuse/Depression: Currently managed on bupropion ER 150 daily, venlafaxine XR 225 mg, and buspirone 10 mg tablets as needed. Currently following with psychiatry through [facility], sees his psychiatrist every 3 months on average. Denies SI/HI.  2) Essential Hypertension: Endorses elevated readings for year, once managed on Clonidine and has not taken in several years. He's had his BP checked at his psychiatry office which is running 160's/90's. He has a family history of hypertension in his father. He does have headaches 2-3 times weekly. No recent treatment for hypertension.   BP Readings from Last 3 Encounters:  04/24/19 (!) 166/100  04/04/17 124/83  01/28/15 138/90     Review of Systems  Respiratory: Negative for shortness of breath.   Cardiovascular: Negative for chest pain.  Neurological: Positive for headaches. Negative for dizziness.  Psychiatric/Behavioral:       See HPI.       Past Medical History:  Diagnosis Date  . Anxiety   . Chronic kidney disease    lithotripsy- 2013  . Degenerative disc disease, lumbar    in lower back, hnp- cervical  . Depression    medical treatment, states psych. gave him Clonidine in addition to other drugs for depression & ^BP  . GERD (gastroesophageal reflux disease)   . Hypertension    medical tretment for 2-68yrs, yet reports lack of compliance on & off. Pt. agrees to take now leading up to surgery     Social History   Socioeconomic History  . Marital status: Married    Spouse name: Not on file  . Number of children: Not on file  . Years of education: Not on file  . Highest education level: Not on file  Occupational History  . Not on file  Social Needs  .  Financial resource strain: Not on file  . Food insecurity    Worry: Not on file    Inability: Not on file  . Transportation needs    Medical: Not on file    Non-medical: Not on file  Tobacco Use  . Smoking status: Current Every Day Smoker    Packs/day: 1.00    Years: 10.00    Pack years: 10.00    Types: Cigarettes    Last attempt to quit: 10/13/2009    Years since quitting: 9.5  . Smokeless tobacco: Former Network engineer and Sexual Activity  . Alcohol use: Yes    Alcohol/week: 2.0 standard drinks    Types: 2 Cans of beer per week    Comment: daily  . Drug use: No  . Sexual activity: Not on file    Comment: did not ask  Lifestyle  . Physical activity    Days per week: Not on file    Minutes per session: Not on file  . Stress: Not on file  Relationships  . Social Herbalist on phone: Not on file    Gets together: Not on file    Attends religious service: Not on file    Active member of club or organization: Not on file    Attends meetings of clubs or organizations: Not on file    Relationship status: Not on file  . Intimate  partner violence    Fear of current or ex partner: Not on file    Emotionally abused: Not on file    Physically abused: Not on file    Forced sexual activity: Not on file  Other Topics Concern  . Not on file  Social History Narrative   Married.   Works in [employer].    Past Surgical History:  Procedure Laterality Date  . ANTERIOR CERVICAL DECOMP/DISCECTOMY FUSION N/A 10/20/2012   Procedure: ANTERIOR CERVICAL DECOMPRESSION/DISCECTOMY FUSION 1 LEVEL;  Surgeon: Tia Alertavid S Jones, MD;  Location: [facility];  Service: Neurosurgery;  Laterality: N/A;  . CARPAL TUNNEL RELEASE     both hands   . 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 Lung cancer Mother   . Depression Mother   . Hyperlipidemia Mother   . Hypertension Father   . Depression Father   . Cancer Maternal  Grandmother        Unsure  . COPD Maternal Grandmother   . Heart disease Maternal Grandmother   . Throat cancer Maternal Grandfather   . Cancer Paternal Grandfather   . Stroke Paternal Grandfather     Allergies  Allergen Reactions  . Amoxicillin Anaphylaxis  . Neurontin [Gabapentin] Other (See Comments)    hallucinations    Current Outpatient Medications on File Prior to Visit  Medication Sig Dispense Refill  . buPROPion (WELLBUTRIN XL) 150 MG 24 hr tablet Take 150 mg by mouth daily.    . busPIRone (BUSPAR) 10 MG tablet Take 10 mg by mouth as needed.    . calcium carbonate (TUMS - DOSED IN MG ELEMENTAL CALCIUM) 500 MG chewable tablet Chew 1 tablet by mouth daily.    Marland Kitchen. venlafaxine XR (EFFEXOR-XR) 75 MG 24 hr capsule Take 225 mg by mouth daily with breakfast.      No current facility-administered medications on file prior to visit.     BP (!) 166/100   Pulse 78   Temp 98.1 F (36.7 C) (Temporal)   Ht 5' 10.25" (1.784 m)   Wt 232 lb 8 oz (105.5 kg)   SpO2 97%   BMI 33.12 kg/m    Objective:   Physical Exam  Constitutional: He appears well-nourished.  Neck: Neck supple.  Cardiovascular: Normal rate and regular rhythm.  Respiratory: Effort normal and breath sounds normal.  Skin: Skin is warm and dry.  Psychiatric: He has a normal mood and affect.           Assessment & Plan:

## 2019-04-24 NOTE — Assessment & Plan Note (Signed)
Above goal in the office today, also during psychiatry visits.  Rx today for lisinopril-HCTZ 20-12.5 mg sent to pharmacy.   Check labs today including CMP, Lipids.  We will plan to see him back in the office in 2-3 weeks for BP check and BMP.

## 2019-04-24 NOTE — Patient Instructions (Signed)
Start lisinopril-hydrochlorothiazide 20-12.5 mg tablet for high blood pressure. Take this once daily in the morning.  Start monitoring your blood pressure daily, around the same time of day, for the next 2-3 weeks.  Ensure that you have rested for 30 minutes prior to checking your blood pressure. Record your readings and bring them to your next visit.  Stop by the lab prior to leaving today. I will notify you of your results once received.   Schedule a follow up visit for 2-3 weeks for blood pressure check.  It was a pleasure to meet you today! Please don't hesitate to call or message me with any questions. Welcome to [facility]!

## 2019-04-24 NOTE — Assessment & Plan Note (Signed)
Following with psychiatry through [facility], feels well managed. Continue same.

## 2019-05-07 ENCOUNTER — Other Ambulatory Visit: Payer: Self-pay

## 2019-05-07 ENCOUNTER — Ambulatory Visit: Payer: 59 | Admitting: Primary Care

## 2019-05-07 ENCOUNTER — Encounter: Payer: Self-pay | Admitting: Primary Care

## 2019-05-07 ENCOUNTER — Other Ambulatory Visit: Payer: Self-pay | Admitting: Primary Care

## 2019-05-07 VITALS — BP 124/84 | HR 95 | Temp 98.1°F | Ht 70.25 in | Wt 224.2 lb

## 2019-05-07 DIAGNOSIS — I10 Essential (primary) hypertension: Secondary | ICD-10-CM | POA: Diagnosis not present

## 2019-05-07 LAB — BASIC METABOLIC PANEL
BUN: 17 mg/dL (ref 6–23)
CO2: 32 mEq/L (ref 19–32)
Calcium: 9.8 mg/dL (ref 8.4–10.5)
Chloride: 101 mEq/L (ref 96–112)
Creatinine, Ser: 1.15 mg/dL (ref 0.40–1.50)
GFR: 70.76 mL/min (ref >60.00)
Glucose, Bld: 78 mg/dL (ref 70–99)
Potassium: 3.3 mEq/L — ABNORMAL LOW (ref 3.5–5.1)
Sodium: 140 mEq/L (ref 135–145)

## 2019-05-07 MED ORDER — LISINOPRIL 20 MG PO TABS: 20.0000 mg | ORAL_TABLET | Freq: Every day | ORAL | 0 refills | Status: DC

## 2019-05-07 NOTE — Assessment & Plan Note (Signed)
Improved in the office today on current regimen.  Discussed to notify me if he has another dizzy/near fainting episode.   Continue current regimen for now. BMP pending.

## 2019-05-07 NOTE — Progress Notes (Signed)
Subjective:    Patient ID: Samuel Cabrera, male    DOB: 12/14/1980, 38 y.o.   MRN: [id number]  HPI  Samuel Cabrera is a 38 year old male who presents today for follow up of hypertension.  He was last evaluated on 04/24/19 as a new patient with a history of hypertension years ago but had not been on treatment recently. He endorsed elevated blood pressure readings during psychiatry appointments, also BP was elevated during that visit so we initiated lisinopril-HCTZ 20-12.5 mg and asked him to follow up.  Since his last visit he's checking his BP at home and is getting readings of 120's/80's over the last week. He has noticed some dizziness when changing from a seated to standing position. He did have an episode of near syncope when turning around after coming inside his house, felt him self "black out" and BP was 80/40. This occurred about one week ago, has not happened since and is feeling well.   BP Readings from Last 3 Encounters:  05/07/19 124/84  04/24/19 (!) 166/100  04/04/17 124/83     Review of Systems  Eyes: Negative for visual disturbance.  Respiratory: Negative for cough and shortness of breath.   Cardiovascular: Negative for chest pain.  Neurological: Negative for dizziness and headaches.       Past Medical History:  Diagnosis Date  . Anxiety   . Chronic kidney disease    lithotripsy- 2013  . Degenerative disc disease, lumbar    in lower back, hnp- cervical  . Depression    medical treatment, states psych. gave him Clonidine in addition to other drugs for depression & ^BP  . GERD (gastroesophageal reflux disease)   . Hypertension    medical tretment for 2-7468yrs, yet reports lack of compliance on & off. Pt. agrees to take now leading up to surgery     Social History   Socioeconomic History  . Marital status: Married    Spouse name: Not on file  . Number of children: Not on file  . Years of education: Not on file  . Highest education level: Not on file   Occupational History  . Not on file  Social Needs  . Financial resource strain: Not on file  . Food insecurity    Worry: Not on file    Inability: Not on file  . Transportation needs    Medical: Not on file    Non-medical: Not on file  Tobacco Use  . Smoking status: Current Every Day Smoker    Packs/day: 1.00    Years: 10.00    Pack years: 10.00    Types: Cigarettes    Last attempt to quit: 10/13/2009    Years since quitting: 9.5  . Smokeless tobacco: Former Engineer, waterUser  Substance and Sexual Activity  . Alcohol use: Yes    Alcohol/week: 2.0 standard drinks    Types: 2 Cans of beer per week    Comment: daily  . Drug use: No  . Sexual activity: Not on file    Comment: did not ask  Lifestyle  . Physical activity    Days per week: Not on file    Minutes per session: Not on file  . Stress: Not on file  Relationships  . Social Musicianconnections    Talks on phone: Not on file    Gets together: Not on file    Attends religious service: Not on file    Active member of club or organization: Not on file  Attends meetings of clubs or organizations: Not on file    Relationship status: Not on file  . Intimate partner violence    Fear of current or ex partner: Not on file    Emotionally abused: Not on file    Physically abused: Not on file    Forced sexual activity: Not on file  Other Topics Concern  . Not on file  Social History Narrative   Married.   Works in [employer].   Travels around [facility] for work.    Past Surgical History:  Procedure Laterality Date  . ANTERIOR CERVICAL DECOMP/DISCECTOMY FUSION N/A 10/20/2012   Procedure: ANTERIOR CERVICAL DECOMPRESSION/DISCECTOMY FUSION 1 LEVEL;  Surgeon: Eustace Moore, MD;  Location: [facility];  Service: Neurosurgery;  Laterality: N/A;  . CARPAL TUNNEL RELEASE     both hands   . 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 Lung cancer Mother   . Depression Mother   .  Hyperlipidemia Mother   . Hypertension Father   . Depression Father   . Cancer Maternal Grandmother        Unsure  . COPD Maternal Grandmother   . Heart disease Maternal Grandmother   . Throat cancer Maternal Grandfather   . Cancer Paternal Grandfather   . Stroke Paternal Grandfather     Allergies  Allergen Reactions  . Amoxicillin Anaphylaxis  . Neurontin [Gabapentin] Other (See Comments)    hallucinations    Current Outpatient Medications on File Prior to Visit  Medication Sig Dispense Refill  . buPROPion (WELLBUTRIN XL) 150 MG 24 hr tablet Take 150 mg by mouth daily.    . busPIRone (BUSPAR) 10 MG tablet Take 10 mg by mouth as needed.    . calcium carbonate (TUMS - DOSED IN MG ELEMENTAL CALCIUM) 500 MG chewable tablet Chew 1 tablet by mouth daily.    Marland Kitchen lisinopril-hydrochlorothiazide (ZESTORETIC) 20-12.5 MG tablet Take 1 tablet by mouth daily. For blood pressure. 30 tablet 0  . venlafaxine XR (EFFEXOR-XR) 75 MG 24 hr capsule Take 225 mg by mouth daily with breakfast.      No current facility-administered medications on file prior to visit.     BP 124/84   Pulse 95   Temp 98.1 F (36.7 C) (Temporal)   Ht 5' 10.25" (1.784 m)   Wt 224 lb 4 oz (101.7 kg)   SpO2 98%   BMI 31.95 kg/m    Objective:   Physical Exam  Constitutional: He appears well-nourished.  Neck: Neck supple.  Cardiovascular: Normal rate and regular rhythm.  Respiratory: Effort normal and breath sounds normal.  Skin: Skin is warm and dry.           Assessment & Plan:

## 2019-05-07 NOTE — Patient Instructions (Signed)
Continue taking lisinopril-hydrochlorothiazide 20-12.5 mg once daily.   Stop by the lab prior to leaving today. I will notify you of your results once received.   It was a pleasure to see you today!

## 2019-05-22 ENCOUNTER — Other Ambulatory Visit: Payer: Self-pay | Admitting: Primary Care

## 2019-05-22 DIAGNOSIS — I10 Essential (primary) hypertension: Secondary | ICD-10-CM

## 2019-05-28 ENCOUNTER — Ambulatory Visit: Payer: 59

## 2019-05-28 ENCOUNTER — Other Ambulatory Visit (INDEPENDENT_AMBULATORY_CARE_PROVIDER_SITE_OTHER): Payer: 59

## 2019-05-28 DIAGNOSIS — I10 Essential (primary) hypertension: Secondary | ICD-10-CM | POA: Diagnosis not present

## 2019-05-28 LAB — BASIC METABOLIC PANEL
BUN: 12 mg/dL (ref 6–23)
CO2: 29 mEq/L (ref 19–32)
Calcium: 9.4 mg/dL (ref 8.4–10.5)
Chloride: 104 mEq/L (ref 96–112)
Creatinine, Ser: 0.95 mg/dL (ref 0.40–1.50)
GFR: 88.19 mL/min (ref >60.00)
Glucose, Bld: 102 mg/dL — ABNORMAL HIGH (ref 70–99)
Potassium: 4.3 mEq/L (ref 3.5–5.1)
Sodium: 139 mEq/L (ref 135–145)

## 2019-07-05 ENCOUNTER — Other Ambulatory Visit: Payer: Self-pay

## 2019-07-05 DIAGNOSIS — Z20822 Contact with and (suspected) exposure to covid-19: Secondary | ICD-10-CM

## 2019-07-06 LAB — NOVEL CORONAVIRUS, NAA: SARS-CoV-2, NAA: NOT DETECTED

## 2019-07-09 ENCOUNTER — Telehealth: Payer: Self-pay | Admitting: General Practice

## 2019-07-09 NOTE — Telephone Encounter (Signed)
Negative COVID results given. Patient results "NOT Detected." Caller expressed understanding. ° °

## 2019-08-14 ENCOUNTER — Other Ambulatory Visit: Payer: Self-pay | Admitting: Primary Care

## 2019-08-14 DIAGNOSIS — I10 Essential (primary) hypertension: Secondary | ICD-10-CM

## 2019-10-23 ENCOUNTER — Encounter: Payer: Self-pay | Admitting: Primary Care

## 2019-10-23 ENCOUNTER — Ambulatory Visit: Payer: 59 | Admitting: Primary Care

## 2019-10-23 ENCOUNTER — Other Ambulatory Visit: Payer: Self-pay

## 2019-10-23 VITALS — BP 136/86 | HR 98 | Temp 97.4°F | Ht 70.25 in | Wt 237.5 lb

## 2019-10-23 DIAGNOSIS — Z720 Tobacco use: Secondary | ICD-10-CM | POA: Insufficient documentation

## 2019-10-23 MED ORDER — VARENICLINE TARTRATE 1 MG PO TABS: 1.0000 mg | ORAL_TABLET | Freq: Two times a day (BID) | ORAL | 0 refills | Status: DC

## 2019-10-23 MED ORDER — CHANTIX STARTING MONTH PAK 0.5 MG X 11 & 1 MG X 42 PO TABS: ORAL_TABLET | ORAL | 0 refills | Status: DC

## 2019-10-23 NOTE — Progress Notes (Signed)
Subjective:    Patient ID: Samuel Cabrera, male    DOB: 07-Feb-1981, 39 y.o.   MRN: [id number]  HPI  This visit occurred during the SARS-CoV-2 public health emergency.  Safety protocols were in place, including screening questions prior to the visit, additional usage of staff PPE, and extensive cleaning of exam room while observing appropriate contact time as indicated for disinfecting solutions.   Samuel Cabrera is a 39 year old male with a history of hypertension, opiate abuse, MDD, tobacco abuse who presents today to discuss smoking cessation.  He is currently smoking cigarettes, 1 PPD mostly, sometimes more. He's been a smoker for 20+ years, ready to quit. He quit once for five years after using Chantix, started back up smoking about 10 years ago. He had no problems on Chantix in the past.    BP Readings from Last 3 Encounters:  10/23/19 136/86  05/07/19 124/84  04/24/19 (!) 166/100     Review of Systems  Eyes: Negative for visual disturbance.  Respiratory: Negative for shortness of breath.        Cough intermittently, chronic  Cardiovascular: Negative for chest pain.  Neurological: Negative for dizziness and headaches.       Past Medical History:  Diagnosis Date  . Anxiety   . Chronic kidney disease    lithotripsy- 2013  . Degenerative disc disease, lumbar    in lower back, hnp- cervical  . Depression    medical treatment, states psych. gave him Clonidine in addition to other drugs for depression & ^BP  . GERD (gastroesophageal reflux disease)   . Hypertension    medical tretment for 2-62yrs, yet reports lack of compliance on & off. Pt. agrees to take now leading up to surgery     Social History   Socioeconomic History  . Marital status: Married    Spouse name: Not on file  . Number of children: Not on file  . Years of education: Not on file  . Highest education level: Not on file  Occupational History  . Not on file  Tobacco Use  . Smoking status: Current  Every Day Smoker    Packs/day: 1.00    Years: 10.00    Pack years: 10.00    Types: Cigarettes    Last attempt to quit: 10/13/2009    Years since quitting: 10.0  . Smokeless tobacco: Former Network engineer and Sexual Activity  . Alcohol use: Yes    Alcohol/week: 2.0 standard drinks    Types: 2 Cans of beer per week    Comment: daily  . Drug use: No  . Sexual activity: Not on file    Comment: did not ask  Other Topics Concern  . Not on file  Social History Narrative   Married.   Works in [employer].   Travels around [facility] for work.   Social Determinants of Health   Financial Resource Strain:   . Difficulty of Paying Living Expenses: Not on file  Food Insecurity:   . Worried About Charity fundraiser in the Last Year: Not on file  . Ran Out of Food in the Last Year: Not on file  Transportation Needs:   . Lack of Transportation (Medical): Not on file  . Lack of Transportation (Non-Medical): Not on file  Physical Activity:   . Days of Exercise per Week: Not on file  . Minutes of Exercise per Session: Not on file  Stress:   . Feeling of Stress : Not on file  Social Connections:   . Frequency of Communication with Friends and Family: Not on file  . Frequency of Social Gatherings with Friends and Family: Not on file  . Attends Religious Services: Not on file  . Active Member of Clubs or Organizations: Not on file  . Attends Banker Meetings: Not on file  . Marital Status: Not on file  Intimate Partner Violence:   . Fear of Current or Ex-Partner: Not on file  . Emotionally Abused: Not on file  . Physically Abused: Not on file  . Sexually Abused: Not on file    Past Surgical History:  Procedure Laterality Date  . ANTERIOR CERVICAL DECOMP/DISCECTOMY FUSION N/A 10/20/2012   Procedure: ANTERIOR CERVICAL DECOMPRESSION/DISCECTOMY FUSION 1 LEVEL;  Surgeon: Tia Alert, MD;  Location: [facility];  Service: Neurosurgery;  Laterality: N/A;  . CARPAL TUNNEL RELEASE     both  hands   . 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 Lung cancer Mother   . Depression Mother   . Hyperlipidemia Mother   . Hypertension Father   . Depression Father   . Cancer Maternal Grandmother        Unsure  . COPD Maternal Grandmother   . Heart disease Maternal Grandmother   . Throat cancer Maternal Grandfather   . Cancer Paternal Grandfather   . Stroke Paternal Grandfather     Allergies  Allergen Reactions  . Amoxicillin Anaphylaxis  . Neurontin [Gabapentin] Other (See Comments)    hallucinations    Current Outpatient Medications on File Prior to Visit  Medication Sig Dispense Refill  . busPIRone (BUSPAR) 10 MG tablet Take 10 mg by mouth as needed.    . calcium carbonate (TUMS - DOSED IN MG ELEMENTAL CALCIUM) 500 MG chewable tablet Chew 1 tablet by mouth daily.    Marland Kitchen lisinopril (ZESTRIL) 20 MG tablet TAKE 1 TABLET BY MOUTH DAILY FOR BLOOD PRESSURE 90 tablet 1  . venlafaxine XR (EFFEXOR-XR) 75 MG 24 hr capsule Take 225 mg by mouth daily with breakfast.      No current facility-administered medications on file prior to visit.    BP 136/86   Pulse 98   Temp (!) 97.4 F (36.3 C) (Temporal)   Ht 5' 10.25" (1.784 m)   Wt 237 lb 8 oz (107.7 kg)   SpO2 98%   BMI 33.84 kg/m    Objective:   Physical Exam  Constitutional: He appears well-nourished.  Cardiovascular: Normal rate and regular rhythm.  Respiratory: Effort normal and breath sounds normal.  Musculoskeletal:     Cervical back: Neck supple.  Skin: Skin is warm and dry.  Psychiatric: He has a normal mood and affect.            Assessment & Plan:

## 2019-10-23 NOTE — Assessment & Plan Note (Signed)
Smoker of 20+ years, ready to quit. Was successful on Chantix in the past without side effects.  Rx for Chantix starting and continuing packs sent to pharmacy. Discussed instructions for use, potential side effects. He will update.

## 2019-10-23 NOTE — Patient Instructions (Addendum)
Before you start Chantix you must choose a smoking quit date. The quit date needs to be within the first two weeks of starting the pill pack.   Start with the Starting Month Pack and then advance to the Continuing Month Pack.  Please update me if you have problems.  It was a pleasure to see you today!  Varenicline oral tablets What is this medicine? VARENICLINE (var e NI kleen) is used to help people quit smoking. It is used with a patient support program recommended by your physician. This medicine may be used for other purposes; ask your health care provider or pharmacist if you have questions. COMMON BRAND NAME(S): Chantix What should I tell my health care provider before I take this medicine? They need to know if you have any of these conditions:  heart disease  if you often drink alcohol  kidney disease  mental illness  on hemodialysis  seizures  history of stroke  suicidal thoughts, plans, or attempt; a previous suicide attempt by you or a family member  an unusual or allergic reaction to varenicline, other medicines, foods, dyes, or preservatives  pregnant or trying to get pregnant  breast-feeding How should I use this medicine? Take this medicine by mouth after eating. Take with a full glass of water. Follow the directions on the prescription label. Take your doses at regular intervals. Do not take your medicine more often than directed. There are 3 ways you can use this medicine to help you quit smoking; talk to your health care professional to decide which plan is right for you: 1) you can choose a quit date and start this medicine 1 week before the quit date, or, 2) you can start taking this medicine before you choose a quit date, and then pick a quit date between day 8 and 35 days of treatment, or, 3) if you are not sure that you are able or willing to quit smoking right away, start taking this medicine and slowly decrease the amount you smoke as directed by your  health care professional with the goal of being cigarette-free by week 12 of treatment. Stick to your plan; ask about support groups or other ways to help you remain cigarette-free. If you are motivated to quit smoking and did not succeed during a previous attempt with this medicine for reasons other than side effects, or if you returned to smoking after this treatment, speak with your health care professional about whether another course of this medicine may be right for you. A special MedGuide will be given to you by the pharmacist with each prescription and refill. Be sure to read this information carefully each time. Talk to your pediatrician regarding the use of this medicine in children. This medicine is not approved for use in children. Overdosage: If you think you have taken too much of this medicine contact a poison control center or emergency room at once. NOTE: This medicine is only for you. Do not share this medicine with others. What if I miss a dose? If you miss a dose, take it as soon as you can. If it is almost time for your next dose, take only that dose. Do not take double or extra doses. What may interact with this medicine?  alcohol  insulin  other medicines used to help people quit smoking  theophylline  warfarin This list may not describe all possible interactions. Give your health care provider a list of all the medicines, herbs, non-prescription drugs, or dietary supplements  you use. Also tell them if you smoke, drink alcohol, or use illegal drugs. Some items may interact with your medicine. What should I watch for while using this medicine? It is okay if you do not succeed at your attempt to quit and have a cigarette. You can still continue your quit attempt and keep using this medicine as directed. Just throw away your cigarettes and get back to your quit plan. Talk to your health care provider before using other treatments to quit smoking. Using this medicine with  other treatments to quit smoking may increase the risk for side effects compared to using a treatment alone. You may get drowsy or dizzy. Do not drive, use machinery, or do anything that needs mental alertness until you know how this medicine affects you. Do not stand or sit up quickly, especially if you are an older patient. This reduces the risk of dizzy or fainting spells. Decrease the number of alcoholic beverages that you drink during treatment with this medicine until you know if this medicine affects your ability to tolerate alcohol. Some people have experienced increased drunkenness (intoxication), unusual or sometimes aggressive behavior, or no memory of things that have happened (amnesia) during treatment with this medicine. Sleepwalking can happen during treatment with this medicine, and can sometimes lead to behavior that is harmful to you, other people, or property. Stop taking this medicine and tell your doctor if you start sleepwalking or have other unusual sleep-related activity. After taking this medicine, you may get up out of bed and do an activity that you do not know you are doing. The next morning, you may have no memory of this. Activities include driving a car ("sleep-driving"), making and eating food, talking on the phone, sexual activity, and sleep-walking. Serious injuries have occurred. Stop the medicine and call your doctor right away if you find out you have done any of these activities. Do not take this medicine if you have used alcohol that evening. Do not take it if you have taken another medicine for sleep. The risk of doing these sleep-related activities is higher. Patients and their families should watch out for new or worsening depression or thoughts of suicide. Also watch out for sudden changes in feelings such as feeling anxious, agitated, panicky, irritable, hostile, aggressive, impulsive, severely restless, overly excited and hyperactive, or not being able to sleep. If  this happens, call your health care professional. If you have diabetes and you quit smoking, the effects of insulin may be increased and you may need to reduce your insulin dose. Check with your doctor or health care professional about how you should adjust your insulin dose. What side effects may I notice from receiving this medicine? Side effects that you should report to your doctor or health care professional as soon as possible:  allergic reactions like skin rash, itching or hives, swelling of the face, lips, tongue, or throat  acting aggressive, being angry or violent, or acting on dangerous impulses  breathing problems  changes in emotions or moods  chest pain or chest tightness  feeling faint or lightheaded, falls  hallucination, loss of contact with reality  mouth sores  redness, blistering, peeling or loosening of the skin, including inside the mouth  signs and symptoms of a stroke like changes in vision; confusion; trouble speaking or understanding; severe headaches; sudden numbness or weakness of the face, arm or leg; trouble walking; dizziness; loss of balance or coordination  seizures  sleepwalking  suicidal thoughts or other mood  changes Side effects that usually do not require medical attention (report to your doctor or health care professional if they continue or are bothersome):  constipation  gas  headache  nausea, vomiting  strange dreams  trouble sleeping This list may not describe all possible side effects. Call your doctor for medical advice about side effects. You may report side effects to FDA at 1-800-FDA-1088. Where should I keep my medicine? Keep out of the reach of children. Store at room temperature between 15 and 30 degrees C (59 and 86 degrees F). Throw away any unused medicine after the expiration date. NOTE: This sheet is a summary. It may not cover all possible information. If you have questions about this medicine, talk to your  doctor, pharmacist, or health care provider.  2020 Elsevier/Gold Standard (2018-08-11 14:27:36)

## 2020-01-16 ENCOUNTER — Other Ambulatory Visit: Payer: Self-pay

## 2020-01-16 ENCOUNTER — Ambulatory Visit: Payer: 59 | Admitting: Primary Care

## 2020-01-16 DIAGNOSIS — Z720 Tobacco use: Secondary | ICD-10-CM | POA: Diagnosis not present

## 2020-01-16 DIAGNOSIS — F40243 Fear of flying: Secondary | ICD-10-CM | POA: Diagnosis not present

## 2020-01-16 MED ORDER — VARENICLINE TARTRATE 1 MG PO TABS: 1.0000 mg | ORAL_TABLET | Freq: Two times a day (BID) | ORAL | 0 refills | Status: DC

## 2020-01-16 MED ORDER — DIAZEPAM 5 MG PO TABS
ORAL_TABLET | ORAL | 0 refills | Status: DC
Start: 2020-01-16 — End: 2023-09-29

## 2020-01-16 NOTE — Patient Instructions (Signed)
Use the diazepam (Valium) 30-60 minutes prior to your flight as needed for anxiety.  Continue the last month of Chantix as discussed.  It was a pleasure to see you today!

## 2020-01-16 NOTE — Assessment & Plan Note (Signed)
Will be traveling to [place] in early June.  Agree to provide a very few tablets of diazepam to help reduce flight anxiety.   Continue venlafaxine.

## 2020-01-16 NOTE — Progress Notes (Signed)
Subjective:    Patient ID: Samuel Cabrera, male    DOB: April 15, 1981, 39 y.o.   MRN: [id number]  HPI  This visit occurred during the SARS-CoV-2 public health emergency.  Safety protocols were in place, including screening questions prior to the visit, additional usage of staff PPE, and extensive cleaning of exam room while observing appropriate contact time as indicated for disinfecting solutions.   Samuel Cabrera is a 39 year old male with a history of hypertension, opiate abuse, MDD, tobacco abuse who presents today requesting anti-anxiety treatment for an upcoming trip. He is also requesting an additional refill of Chantix.   He will be leaving June 3rd, 2021 for a trip to [place]. He will be flying from [place] to [place], then [place] to [place]. He will return on June 13th, 2021 on the same flight path.  He is requesting Valium to help reduce symptoms of anxiety, claustrophobia, feeling sweaty that typically occur with longer flights. He can handle shorter flights but is worried about his flight to [place].  He is compliant to his venlafaxine XR 75 mg and is doing well. He uses buspirone as needed.   He would also like an additional refill of his Chantix continuing pack that was prescribed in February 2021. He didn't start the course until early March 2021 and has noticed a reduction in cigarette cravings. He was smoking over 1 PPD, now is down to less than 1 PPD.   BP Readings from Last 3 Encounters:  01/16/20 136/86  10/23/19 136/86  05/07/19 124/84     Review of Systems  Respiratory: Negative for shortness of breath.   Cardiovascular: Negative for chest pain.  Psychiatric/Behavioral:       See HPI       Past Medical History:  Diagnosis Date  . Anxiety   . Chronic kidney disease    lithotripsy- 2013  . Degenerative disc disease, lumbar    in lower back, hnp- cervical  . Depression    medical treatment, states psych. gave him Clonidine in addition to other drugs for  depression & ^BP  . GERD (gastroesophageal reflux disease)   . Hypertension    medical tretment for 2-74yrs, yet reports lack of compliance on & off. Pt. agrees to take now leading up to surgery     Social History   Socioeconomic History  . Marital status: Married    Spouse name: Not on file  . Number of children: Not on file  . Years of education: Not on file  . Highest education level: Not on file  Occupational History  . Not on file  Tobacco Use  . Smoking status: Current Every Day Smoker    Packs/day: 1.00    Years: 10.00    Pack years: 10.00    Types: Cigarettes    Last attempt to quit: 10/13/2009    Years since quitting: 10.2  . Smokeless tobacco: Former Network engineer and Sexual Activity  . Alcohol use: Yes    Alcohol/week: 2.0 standard drinks    Types: 2 Cans of beer per week    Comment: daily  . Drug use: No  . Sexual activity: Not on file    Comment: did not ask  Other Topics Concern  . Not on file  Social History Narrative   Married.   Works in [employer].   Travels around [facility] for work.   Social Determinants of Health   Financial Resource Strain:   . Difficulty of Paying Living Expenses:  Food Insecurity:   . Worried About Programme researcher, broadcasting/film/video in the Last Year:   . Barista in the Last Year:   Transportation Needs:   . Freight forwarder (Medical):   Marland Kitchen Lack of Transportation (Non-Medical):   Physical Activity:   . Days of Exercise per Week:   . Minutes of Exercise per Session:   Stress:   . Feeling of Stress :   Social Connections:   . Frequency of Communication with Friends and Family:   . Frequency of Social Gatherings with Friends and Family:   . Attends Religious Services:   . Active Member of Clubs or Organizations:   . Attends Banker Meetings:   Marland Kitchen Marital Status:   Intimate Partner Violence:   . Fear of Current or Ex-Partner:   . Emotionally Abused:   Marland Kitchen Physically Abused:   . Sexually Abused:     Past Surgical  History:  Procedure Laterality Date  . ANTERIOR CERVICAL DECOMP/DISCECTOMY FUSION N/A 10/20/2012   Procedure: ANTERIOR CERVICAL DECOMPRESSION/DISCECTOMY FUSION 1 LEVEL;  Surgeon: Tia Alert, MD;  Location: [facility];  Service: Neurosurgery;  Laterality: N/A;  . CARPAL TUNNEL RELEASE     both hands   . 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 Lung cancer Mother   . Depression Mother   . Hyperlipidemia Mother   . Hypertension Father   . Depression Father   . Cancer Maternal Grandmother        Unsure  . COPD Maternal Grandmother   . Heart disease Maternal Grandmother   . Throat cancer Maternal Grandfather   . Cancer Paternal Grandfather   . Stroke Paternal Grandfather     Allergies  Allergen Reactions  . Amoxicillin Anaphylaxis  . Neurontin [Gabapentin] Other (See Comments)    hallucinations    Current Outpatient Medications on File Prior to Visit  Medication Sig Dispense Refill  . busPIRone (BUSPAR) 10 MG tablet Take 10 mg by mouth as needed.    . calcium carbonate (TUMS - DOSED IN MG ELEMENTAL CALCIUM) 500 MG chewable tablet Chew 1 tablet by mouth daily.    Marland Kitchen lisinopril (ZESTRIL) 20 MG tablet TAKE 1 TABLET BY MOUTH DAILY FOR BLOOD PRESSURE 90 tablet 1  . venlafaxine XR (EFFEXOR-XR) 75 MG 24 hr capsule Take 225 mg by mouth daily with breakfast.      No current facility-administered medications on file prior to visit.    BP 136/86   Pulse 92   Temp (!) 96.9 F (36.1 C) (Temporal)   Ht 5' 10.5" (1.791 m)   Wt 238 lb (108 kg)   SpO2 100%   BMI 33.67 kg/m    Objective:   Physical Exam  Constitutional: He appears well-nourished.  Cardiovascular: Normal rate and regular rhythm.  Respiratory: Effort normal and breath sounds normal.  Musculoskeletal:     Cervical back: Neck supple.  Skin: Skin is warm and dry.  Psychiatric: He has a normal mood and affect.           Assessment & Plan:

## 2020-01-16 NOTE — Assessment & Plan Note (Signed)
Additional refill provided for Chantix continuing pack. He will update.

## 2020-08-08 ENCOUNTER — Other Ambulatory Visit: Payer: Self-pay | Admitting: Primary Care

## 2020-08-08 DIAGNOSIS — I10 Essential (primary) hypertension: Secondary | ICD-10-CM

## 2021-03-10 ENCOUNTER — Other Ambulatory Visit: Payer: Self-pay | Admitting: Primary Care

## 2021-03-10 DIAGNOSIS — I10 Essential (primary) hypertension: Secondary | ICD-10-CM

## 2021-04-02 ENCOUNTER — Encounter: Payer: Self-pay | Admitting: Primary Care

## 2021-04-02 ENCOUNTER — Other Ambulatory Visit: Payer: Self-pay

## 2021-04-02 ENCOUNTER — Ambulatory Visit (INDEPENDENT_AMBULATORY_CARE_PROVIDER_SITE_OTHER): Payer: 59 | Admitting: Primary Care

## 2021-04-02 VITALS — BP 132/78 | HR 72 | Temp 97.7°F | Ht 70.5 in | Wt 217.0 lb

## 2021-04-02 DIAGNOSIS — Z0001 Encounter for general adult medical examination with abnormal findings: Secondary | ICD-10-CM | POA: Diagnosis not present

## 2021-04-02 DIAGNOSIS — I10 Essential (primary) hypertension: Secondary | ICD-10-CM | POA: Diagnosis not present

## 2021-04-02 DIAGNOSIS — G4700 Insomnia, unspecified: Secondary | ICD-10-CM | POA: Insufficient documentation

## 2021-04-02 DIAGNOSIS — F329 Major depressive disorder, single episode, unspecified: Secondary | ICD-10-CM

## 2021-04-02 DIAGNOSIS — K219 Gastro-esophageal reflux disease without esophagitis: Secondary | ICD-10-CM | POA: Diagnosis not present

## 2021-04-02 LAB — LIPID PANEL
Cholesterol: 172 mg/dL (ref 0–200)
HDL: 35.9 mg/dL — ABNORMAL LOW (ref >39.00)
LDL Cholesterol: 108 mg/dL — ABNORMAL HIGH (ref 0–99)
NonHDL: 136.05
Total CHOL/HDL Ratio: 5
Triglycerides: 141 mg/dL (ref 0.0–149.0)
VLDL: 28.2 mg/dL (ref 0.0–40.0)

## 2021-04-02 LAB — CBC
HCT: 39.6 % (ref 39.0–52.0)
Hemoglobin: 13.5 g/dL (ref 13.0–17.0)
MCHC: 34.2 g/dL (ref 30.0–36.0)
MCV: 92.2 fl (ref 78.0–100.0)
Platelets: 278 10*3/uL (ref 150.0–400.0)
RBC: 4.29 Mil/uL (ref 4.22–5.81)
RDW: 13.2 % (ref 11.5–15.5)
WBC: 5.8 10*3/uL (ref 4.0–10.5)

## 2021-04-02 LAB — COMPREHENSIVE METABOLIC PANEL
ALT: 10 U/L (ref 0–53)
AST: 15 U/L (ref 0–37)
Albumin: 4.2 g/dL (ref 3.5–5.2)
Alkaline Phosphatase: 70 U/L (ref 39–117)
BUN: 12 mg/dL (ref 6–23)
CO2: 26 mEq/L (ref 19–32)
Calcium: 9.6 mg/dL (ref 8.4–10.5)
Chloride: 105 mEq/L (ref 96–112)
Creatinine, Ser: 1.02 mg/dL (ref 0.40–1.50)
GFR: 91.62 mL/min (ref >60.00)
Glucose, Bld: 86 mg/dL (ref 70–99)
Potassium: 3.6 mEq/L (ref 3.5–5.1)
Sodium: 140 mEq/L (ref 135–145)
Total Bilirubin: 0.4 mg/dL (ref 0.2–1.2)
Total Protein: 6.8 g/dL (ref 6.0–8.3)

## 2021-04-02 MED ORDER — OMEPRAZOLE 20 MG PO CPDR: 20.0000 mg | DELAYED_RELEASE_CAPSULE | Freq: Every day | ORAL | 0 refills | Status: AC

## 2021-04-02 MED ORDER — MIRTAZAPINE 15 MG PO TABS: 15.0000 mg | ORAL_TABLET | Freq: Every day | ORAL | 0 refills | Status: DC

## 2021-04-02 NOTE — Assessment & Plan Note (Signed)
Following with psychiatry, feels depression is well managed on venlafaxine ER 225 mg. Recommended he discuss his anxiety and insomnia tomorrow during his phone visit.  See notes regarding insomnia.

## 2021-04-02 NOTE — Assessment & Plan Note (Signed)
Secondary to uncontrolled anxiety, discussed this today. Recommended he discuss his anxiety and insomnia with his psychiatrist tomorrow.  He has failed numerous OTC products, Trazodone is ineffective and causes drowsiness the following day. Trial of mirtazapine 15 mg sent to pharmacy. He will update.

## 2021-04-02 NOTE — Progress Notes (Signed)
Subjective:    Patient ID: Samuel Cabrera, male    DOB: [birth year], 40 y.o.   MRN: [id number]  HPI  Samuel Cabrera is a very pleasant 40 y.o. male who presents today for complete physical and follow up of chronic conditions.  He would also like to discuss insomnia. Following with [facility]iatry and has been prescribed Trazodone previously. Trazodone causes drowsiness the following day and is not consistently effective for insomnia. He does feel that venlafaxine XR 225 mg helps with depression but not with anxiety. He cannot shut down his mind at night as it races while in bed. He will lay there for hours, gets 3-5 hours of sleep nightly on average. He's tried several OTC sleeping aids without improvement. He denies excessive caffeine consumption. He follows with [facility], doesn't feel well managed, has mentioned the inability to sleep numerous times before. He does have a phone visit scheduled with psychiatry tomorrow.   He would also like to discuss nausea, vomiting, and "thick phlegm" to the esophagus. Symptoms are intermittent, occur during the day and when getting out of bed in the morning. Will vomit up "water" and "acid" at times. He endorses "heartburn" symptoms on occasion. He's taken Tums a few times. He denies unexplained weight loss, hemoptysis, night sweats.   Immunizations: -Tetanus: 2018 -Covid-19: Has not completed   Diet: Fair diet.  Exercise: No regular exercise.  Eye exam: No recent exam   Dental exam: Completes semi-annually   BP Readings from Last 3 Encounters:  04/02/21 132/78  01/16/20 136/86  10/23/19 136/86        Review of Systems  Constitutional:  Negative for unexpected weight change.  HENT:  Negative for rhinorrhea.   Respiratory:  Negative for shortness of breath.   Cardiovascular:  Negative for chest pain.  Gastrointestinal:  Positive for nausea and vomiting. Negative for constipation and diarrhea.       Esophageal burning   Genitourinary:  Negative for difficulty urinating.  Musculoskeletal:  Positive for arthralgias and neck pain.  Skin:  Negative for rash.  Allergic/Immunologic: Negative for environmental allergies.  Neurological:  Positive for headaches. Negative for dizziness.  Psychiatric/Behavioral:  Positive for sleep disturbance. The patient is nervous/anxious.         Past Medical History:  Diagnosis Date   Anxiety    Chronic kidney disease    lithotripsy- 2013   Degenerative disc disease, lumbar    in lower back, hnp- cervical   Depression    medical treatment, states psych. gave him Clonidine in addition to other drugs for depression & ^BP   GERD (gastroesophageal reflux disease)    Hypertension    medical tretment for 2-14yrs, yet reports lack of compliance on & off. Pt. agrees to take now leading up to surgery    Social History   Socioeconomic History   Marital status: Married    Spouse name: Not on file   Number of children: Not on file   Years of education: Not on file   Highest education level: Not on file  Occupational History   Not on file  Tobacco Use   Smoking status: Every Day    Packs/day: 1.00    Years: 10.00    Pack years: 10.00    Types: Cigarettes    Last attempt to quit: 10/13/2009    Years since quitting: 11.4   Smokeless tobacco: Former  Substance and Sexual Activity   Alcohol use: Yes    Alcohol/week: 2.0 standard drinks  Types: 2 Cans of beer per week    Comment: daily   Drug use: No   Sexual activity: Not on file    Comment: did not ask  Other Topics Concern   Not on file  Social History Narrative   Married.   Works in [employer].   Travels around [facility] for work.   Social Determinants of Health   Financial Resource Strain: Not on file  Food Insecurity: Not on file  Transportation Needs: Not on file  Physical Activity: Not on file  Stress: Not on file  Social Connections: Not on file  Intimate Partner Violence: Not on file    Past Surgical  History:  Procedure Laterality Date   ANTERIOR CERVICAL DECOMP/DISCECTOMY FUSION N/A 10/20/2012   Procedure: ANTERIOR CERVICAL DECOMPRESSION/DISCECTOMY FUSION 1 LEVEL;  Surgeon: Tia Alert, MD;  Location: [facility];  Service: Neurosurgery;  Laterality: N/A;   CARPAL TUNNEL RELEASE     both hands    EYE SURGERY     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    Family History  Problem Relation Age of Onset   Lung cancer Mother    Depression Mother    Hyperlipidemia Mother    Hypertension Father    Depression Father    Cancer Maternal Grandmother        Unsure   COPD Maternal Grandmother    Heart disease Maternal Grandmother    Throat cancer Maternal Grandfather    Cancer Paternal Grandfather    Stroke Paternal Grandfather     Allergies  Allergen Reactions   Amoxicillin Anaphylaxis   Neurontin [Gabapentin] Other (See Comments)    hallucinations    Current Outpatient Medications on File Prior to Visit  Medication Sig Dispense Refill   calcium carbonate (TUMS - DOSED IN MG ELEMENTAL CALCIUM) 500 MG chewable tablet Chew 1 tablet by mouth daily.     diazepam (VALIUM) 5 MG tablet Take 1 tablet by mouth 30-60 minutes prior to flight as needed for anxiety. 4 tablet 0   lisinopril (ZESTRIL) 20 MG tablet TAKE 1 TABLET BY MOUTH ONCE  DAILY FOR BLOOD PRESSURE 30 tablet 0   varenicline (CHANTIX CONTINUING MONTH PAK) 1 MG tablet Take 1 tablet (1 mg total) by mouth 2 (two) times daily. 60 tablet 0   venlafaxine XR (EFFEXOR-XR) 75 MG 24 hr capsule Take 225 mg by mouth daily with breakfast.      No current facility-administered medications on file prior to visit.    BP 132/78   Pulse 72   Temp 97.7 F (36.5 C) (Temporal)   Ht 5' 10.5" (1.791 m)   Wt 217 lb (98.4 kg)   SpO2 98%   BMI 30.70 kg/m  Objective:   Physical Exam HENT:     Right Ear: Tympanic membrane and ear canal normal.     Left Ear: Tympanic membrane and ear canal normal.     Nose: Nose normal.     Right Sinus:  No maxillary sinus tenderness or frontal sinus tenderness.     Left Sinus: No maxillary sinus tenderness or frontal sinus tenderness.  Eyes:     Conjunctiva/sclera: Conjunctivae normal.  Neck:     Thyroid: No thyromegaly.     Vascular: No carotid bruit.  Cardiovascular:     Rate and Rhythm: Normal rate and regular rhythm.     Heart sounds: Normal heart sounds.  Pulmonary:     Effort: Pulmonary effort is normal.     Breath sounds:  Normal breath sounds. No wheezing or rales.  Abdominal:     General: Bowel sounds are normal.     Palpations: Abdomen is soft.     Tenderness: There is no abdominal tenderness.  Musculoskeletal:        General: Normal range of motion.     Cervical back: Neck supple.  Skin:    General: Skin is warm and dry.  Neurological:     Mental Status: He is alert and oriented to person, place, and time.     Cranial Nerves: No cranial nerve deficit.     Deep Tendon Reflexes: Reflexes are normal and symmetric.  Psychiatric:        Mood and Affect: Mood normal.          Assessment & Plan:      This visit occurred during the SARS-CoV-2 public health emergency.  Safety protocols were in place, including screening questions prior to the visit, additional usage of staff PPE, and extensive cleaning of exam room while observing appropriate contact time as indicated for disinfecting solutions.

## 2021-04-02 NOTE — Assessment & Plan Note (Signed)
Symptoms of nausea with vomiting are more consistent for GERD. No alarm signs.  Discussed triggers. Will trial omeprazole 20 mg daily to see if this helps. He will update.

## 2021-04-02 NOTE — Patient Instructions (Signed)
Stop by the lab prior to leaving today. I will notify you of your results once received.   Start mirtazapine 15 mg nightly for sleep.   Start omeprazole 20 mg daily for heartburn, nausea, vomiting.    It was a pleasure to see you today!  Preventive Care 41-41 Years Old, Male Preventive care refers to lifestyle choices and visits with your health care provider that can promote health and wellness. This includes: A yearly physical exam. This is also called an annual wellness visit. Regular dental and eye exams. Immunizations. Screening for certain conditions. Healthy lifestyle choices, such as: Eating a healthy diet. Getting regular exercise. Not using drugs or products that contain nicotine and tobacco. Limiting alcohol use. What can I expect for my preventive care visit? Physical exam Your health care provider will check your: Height and weight. These may be used to calculate your BMI (body mass index). BMI is a measurement that tells if you are at a healthy weight. Heart rate and blood pressure. Body temperature. Skin for abnormal spots. Counseling Your health care provider may ask you questions about your: Past medical problems. Family's medical history. Alcohol, tobacco, and drug use. Emotional well-being. Home life and relationship well-being. Sexual activity. Diet, exercise, and sleep habits. Work and work Astronomer. Access to firearms. What immunizations do I need?  Vaccines are usually given at various ages, according to a schedule. Your health care provider will recommend vaccines for you based on your age, medicalhistory, and lifestyle or other factors, such as travel or where you work. What tests do I need? Blood tests Lipid and cholesterol levels. These may be checked every 5 years, or more often if you are over 7 years old. Hepatitis C test. Hepatitis B test. Screening Lung cancer screening. You may have this screening every year starting at age 34 if you  have a 30-pack-year history of smoking and currently smoke or have quit within the past 15 years. Prostate cancer screening. Recommendations will vary depending on your family history and other risks. Genital exam to check for testicular cancer or hernias. Colorectal cancer screening. All adults should have this screening starting at age 31 and continuing until age 82. Your health care provider may recommend screening at age 88 if you are at increased risk. You will have tests every 1-10 years, depending on your results and the type of screening test. Diabetes screening. This is done by checking your blood sugar (glucose) after you have not eaten for a while (fasting). You may have this done every 1-3 years. STD (sexually transmitted disease) testing, if you are at risk. Follow these instructions at home: Eating and drinking  Eat a diet that includes fresh fruits and vegetables, whole grains, lean protein, and low-fat dairy products. Take vitamin and mineral supplements as recommended by your health care provider. Do not drink alcohol if your health care provider tells you not to drink. If you drink alcohol: Limit how much you have to 0-2 drinks a day. Be aware of how much alcohol is in your drink. In the [place] one drink equals one 12 oz bottle of beer (355 mL), one 5 oz glass of wine (148 mL), or one 1 oz glass of hard liquor (44 mL).  Lifestyle Take daily care of your teeth and gums. Brush your teeth every morning and night with fluoride toothpaste. Floss one time each day. Stay active. Exercise for at least 30 minutes 5 or more days each week. Do not use any products that contain  nicotine or tobacco, such as cigarettes, e-cigarettes, and chewing tobacco. If you need help quitting, ask your health care provider. Do not use drugs. If you are sexually active, practice safe sex. Use a condom or other form of protection to prevent STIs (sexually transmitted infections). If told by your  health care provider, take low-dose aspirin daily starting at age 5. Find healthy ways to cope with stress, such as: Meditation, yoga, or listening to music. Journaling. Talking to a trusted person. Spending time with friends and family. Safety Always wear your seat belt while driving or riding in a vehicle. Do not drive: If you have been drinking alcohol. Do not ride with someone who has been drinking. When you are tired or distracted. While texting. Wear a helmet and other protective equipment during sports activities. If you have firearms in your house, make sure you follow all gun safety procedures. What's next? Go to your health care provider once a year for an annual wellness visit. Ask your health care provider how often you should have your eyes and teeth checked. Stay up to date on all vaccines. This information is not intended to replace advice given to you by your health care provider. Make sure you discuss any questions you have with your healthcare provider. Document Revised: 05/22/2019 Document Reviewed: 08/17/2018 Elsevier Patient Education  2022 [employer].

## 2021-04-02 NOTE — Assessment & Plan Note (Signed)
Tetanus UTD. Discussed the importance of a healthy diet and regular exercise in order for weight loss, and to reduce the risk of further co-morbidity.  Exam today as noted. Labs pending.

## 2021-04-02 NOTE — Assessment & Plan Note (Signed)
Overall stable in the office today, continue lisinopril 20 mg. CMP pending.

## 2021-04-16 ENCOUNTER — Other Ambulatory Visit: Payer: Self-pay | Admitting: Primary Care

## 2021-04-16 DIAGNOSIS — I10 Essential (primary) hypertension: Secondary | ICD-10-CM

## 2021-04-17 NOTE — Telephone Encounter (Signed)
Seen 04/02/2021 for CPE refill sent in as requested.

## 2021-05-05 ENCOUNTER — Other Ambulatory Visit: Payer: Self-pay | Admitting: Primary Care

## 2021-05-05 DIAGNOSIS — G4700 Insomnia, unspecified: Secondary | ICD-10-CM

## 2021-05-06 NOTE — Telephone Encounter (Signed)
How is he doing on mirtazapine medication that I prescribed last month for sleep?  Received refill request from pharmacy, does he want to continue? If so then okay to send as pended.

## 2021-05-18 NOTE — Telephone Encounter (Signed)
Left message to return call to our office.  

## 2021-05-26 NOTE — Telephone Encounter (Signed)
Called patient he does not take every night but would like to continue. Has about 1/2 a bottle at this time so refill is not needed will cal when needs sent in

## 2021-07-06 ENCOUNTER — Other Ambulatory Visit: Payer: Self-pay | Admitting: Primary Care

## 2021-07-06 DIAGNOSIS — I10 Essential (primary) hypertension: Secondary | ICD-10-CM

## 2021-11-26 ENCOUNTER — Other Ambulatory Visit: Payer: Self-pay

## 2021-11-26 ENCOUNTER — Encounter: Payer: Self-pay | Admitting: Primary Care

## 2021-11-26 ENCOUNTER — Ambulatory Visit: Payer: 59 | Admitting: Primary Care

## 2021-11-26 VITALS — BP 126/74 | HR 104 | Temp 98.6°F | Ht 70.5 in | Wt 211.0 lb

## 2021-11-26 DIAGNOSIS — Z9889 Other specified postprocedural states: Secondary | ICD-10-CM | POA: Diagnosis not present

## 2021-11-26 DIAGNOSIS — M542 Cervicalgia: Secondary | ICD-10-CM

## 2021-11-26 MED ORDER — CYCLOBENZAPRINE HCL 10 MG PO TABS: 10.0000 mg | ORAL_TABLET | Freq: Three times a day (TID) | ORAL | 0 refills | Status: DC | PRN

## 2021-11-26 MED ORDER — PREDNISONE 20 MG PO TABS: ORAL_TABLET | ORAL | 0 refills | Status: DC

## 2021-11-26 NOTE — Assessment & Plan Note (Signed)
Suspicious for nerve impingement given HPI and exam today. ? ?Stat MRI cervical spine ordered. ?He will see neurosurgery as scheduled ? ?Rx for prednisone 20 mg taper sent to pharmacy. ?Rx for cyclobenzaprine 10 mg sent to pharmacy to use as needed, discussed drowsiness precautions. ? ?Await results. ?

## 2021-11-26 NOTE — Progress Notes (Signed)
? ?Subjective:  ? ? Patient ID: Samuel Cabrera, male    DOB: [birth year], 41 y.o.   MRN: [id number] ? ?HPI ? ?Samuel Cabrera is a very pleasant 41 y.o. male with a history of hypertension, GERD, MDD, GAD, opiate abuse in remission, anterior cervical decompression/discectomy who presents today to discuss neck pain and request MRI. ? ?Chronic history of neck pain, underwent anterior cervical decompression/discectomy fusion in 2014.  ? ?Current symptoms include mid to lower right posterior neck pain with radiation down to right upper extremity down to fingers. Also with numbness to bilateral upper extremities, numbness moves down to first three digits on right side, he has chronic left first digit numbness. He has minimal range of motion to his neck and most planes of movement. ? ?Symptoms began three weeks ago after waking up form sleeping on the sofa. He's been taking Ibuprofen and Tylenol with minimal improvement. He's not sleeping well.  He denies injury/trauma. ? ?He has an appointment scheduled with neurosurgery in April 2023 and is needing to complete a MRI of the cervical spine prior to his visit. ? ?BP Readings from Last 3 Encounters:  ?11/26/21 126/74  ?04/02/21 132/78  ?01/16/20 136/86  ? ? ? ? ?Review of Systems  ?Musculoskeletal:  Positive for arthralgias and neck pain.  ?Neurological:  Positive for numbness. Negative for weakness.  ? ?   ? ? ?Past Medical History:  ?Diagnosis Date  ? Anxiety   ? Chronic kidney disease   ? lithotripsy- 2013  ? Degenerative disc disease, lumbar   ? in lower back, hnp- cervical  ? Depression   ? medical treatment, states psych. gave him Clonidine in addition to other drugs for depression & ^BP  ? GERD (gastroesophageal reflux disease)   ? Hypertension   ? medical tretment for 2-71yrs, yet reports lack of compliance on & off. Pt. agrees to take now leading up to surgery  ? ? ?Social History  ? ?Socioeconomic History  ? Marital status: Married  ?  Spouse name: Not on file   ? Number of children: Not on file  ? Years of education: Not on file  ? Highest education level: Not on file  ?Occupational History  ? Not on file  ?Tobacco Use  ? Smoking status: Every Day  ?  Packs/day: 1.00  ?  Years: 10.00  ?  Pack years: 10.00  ?  Types: Cigarettes  ?  Last attempt to quit: 10/13/2009  ?  Years since quitting: 12.1  ? Smokeless tobacco: Former  ?Substance and Sexual Activity  ? Alcohol use: Yes  ?  Alcohol/week: 2.0 standard drinks  ?  Types: 2 Cans of beer per week  ?  Comment: daily  ? Drug use: No  ? Sexual activity: Not on file  ?  Comment: did not ask  ?Other Topics Concern  ? Not on file  ?Social History Narrative  ? Married.  ? Works in [employer].  ? Travels around [place] for work.  ? ?Social Determinants of Health  ? ?Financial Resource Strain: Not on file  ?Food Insecurity: Not on file  ?Transportation Needs: Not on file  ?Physical Activity: Not on file  ?Stress: Not on file  ?Social Connections: Not on file  ?Intimate Partner Violence: Not on file  ? ? ?Past Surgical History:  ?Procedure Laterality Date  ? ANTERIOR CERVICAL DECOMP/DISCECTOMY FUSION N/A 10/20/2012  ? Procedure: ANTERIOR CERVICAL DECOMPRESSION/DISCECTOMY FUSION 1 LEVEL;  Surgeon: Tia Alert, MD;  Location: [facility] NEURO  ORS;  Service: Neurosurgery;  Laterality: N/A;  ? CARPAL TUNNEL RELEASE    ? both hands   ? EYE SURGERY    ? 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 ? ?Family History  ?Problem Relation Age of Onset  ? Lung cancer Mother   ? Depression Mother   ? Hyperlipidemia Mother   ? Hypertension Father   ? Depression Father   ? Cancer Maternal Grandmother   ?     Unsure  ? COPD Maternal Grandmother   ? Heart disease Maternal Grandmother   ? Throat cancer Maternal Grandfather   ? Cancer Paternal Grandfather   ? Stroke Paternal Grandfather   ? ? ?Allergies  ?Allergen Reactions  ? Amoxicillin Anaphylaxis  ? Neurontin [Gabapentin] Other (See Comments)  ?  hallucinations  ? ? ?Current Outpatient Medications on File Prior to  Visit  ?Medication Sig Dispense Refill  ? diazepam (VALIUM) 5 MG tablet Take 1 tablet by mouth 30-60 minutes prior to flight as needed for anxiety. 4 tablet 0  ? lisinopril (ZESTRIL) 20 MG tablet TAKE 1 TABLET BY MOUTH ONCE  DAILY FOR BLOOD PRESSURE 90 tablet 2  ? omeprazole (PRILOSEC) 20 MG capsule Take 1 capsule (20 mg total) by mouth daily. For heartbun 90 capsule 0  ? venlafaxine XR (EFFEXOR-XR) 75 MG 24 hr capsule Take 75 mg by mouth daily with breakfast. Bid    ? ?No current facility-administered medications on file prior to visit.  ? ? ?BP 126/74   Pulse (!) 104   Temp 98.6 ?F (37 ?C) (Oral)   Ht 5' 10.5" (1.791 m)   Wt 211 lb (95.7 kg)   SpO2 96%   BMI 29.85 kg/m?  ?Objective:  ? Physical Exam ?Constitutional:   ?   General: He is not in acute distress. ?Neck:  ? ?   Comments: Decrease in range of motion with flexion, extension, right and lateral rotation.  ?Musculoskeletal:  ?   Cervical back: Pain with movement and muscular tenderness present. Decreased range of motion.  ?   Comments: 5 out of 5 strength bilateral upper extremities  ? ? ? ? ? ?   ?Assessment & Plan:  ? ? ? ? ?This visit occurred during the SARS-CoV-2 public health emergency.  Safety protocols were in place, including screening questions prior to the visit, additional usage of staff PPE, and extensive cleaning of exam room while observing appropriate contact time as indicated for disinfecting solutions.  ?

## 2021-11-26 NOTE — Patient Instructions (Signed)
You will be contacted regarding your MRI.  Please let us know if you have not been contacted within two days. ? ?You may take cyclobenzaprine (muscle relaxer) every 8 hours as needed for neck pain/muscle spasms.  This may cause drowsiness. ? ?Start prednisone 20 mg.  Take 3 tablets by mouth once daily for 3 days, then 2 tablets daily for 3 days, then 1 tablet daily for 3 days.  Take these in the morning. ? ?It was a pleasure to see you today! ? ? ?

## 2021-12-01 ENCOUNTER — Telehealth: Payer: Self-pay | Admitting: Primary Care

## 2021-12-01 NOTE — Telephone Encounter (Signed)
Noted, will await results.

## 2021-12-01 NOTE — Telephone Encounter (Signed)
Patient's wife called regarding STAT MRI, it has been authorized ? ?Patient's wife is calling [facility] Scheduling  [phone] ?

## 2021-12-02 ENCOUNTER — Ambulatory Visit
Admission: RE | Admit: 2021-12-02 | Discharge: 2021-12-02 | Disposition: A | Discharge status: Home / Self Care | Payer: 59 | Source: Ambulatory Visit | Attending: Primary Care | Admitting: Primary Care

## 2021-12-02 DIAGNOSIS — M542 Cervicalgia: Secondary | ICD-10-CM | POA: Insufficient documentation

## 2021-12-02 DIAGNOSIS — Z9889 Other specified postprocedural states: Secondary | ICD-10-CM | POA: Insufficient documentation

## 2022-07-20 ENCOUNTER — Other Ambulatory Visit: Payer: Self-pay | Admitting: Primary Care

## 2022-07-20 DIAGNOSIS — I10 Essential (primary) hypertension: Secondary | ICD-10-CM

## 2022-07-21 NOTE — Telephone Encounter (Signed)
Patient is overdue for his annual follow up with labs. Needs to be scheduled ASAP. Let me know once this has been done. Thanks!

## 2022-07-21 NOTE — Telephone Encounter (Signed)
Patient scheduled for 11/29

## 2022-08-04 ENCOUNTER — Encounter: Payer: Self-pay | Admitting: Primary Care

## 2022-08-04 ENCOUNTER — Ambulatory Visit (INDEPENDENT_AMBULATORY_CARE_PROVIDER_SITE_OTHER): Payer: 59 | Admitting: Primary Care

## 2022-08-04 VITALS — BP 136/86 | HR 80 | Temp 98.2°F | Ht 70.5 in | Wt 224.0 lb

## 2022-08-04 DIAGNOSIS — I10 Essential (primary) hypertension: Secondary | ICD-10-CM | POA: Diagnosis not present

## 2022-08-04 DIAGNOSIS — Z114 Encounter for screening for human immunodeficiency virus [HIV]: Secondary | ICD-10-CM

## 2022-08-04 DIAGNOSIS — Z9889 Other specified postprocedural states: Secondary | ICD-10-CM

## 2022-08-04 DIAGNOSIS — M542 Cervicalgia: Secondary | ICD-10-CM

## 2022-08-04 DIAGNOSIS — K219 Gastro-esophageal reflux disease without esophagitis: Secondary | ICD-10-CM | POA: Diagnosis not present

## 2022-08-04 DIAGNOSIS — F320 Major depressive disorder, single episode, mild: Secondary | ICD-10-CM

## 2022-08-04 DIAGNOSIS — Z0001 Encounter for general adult medical examination with abnormal findings: Secondary | ICD-10-CM | POA: Diagnosis not present

## 2022-08-04 DIAGNOSIS — Z1159 Encounter for screening for other viral diseases: Secondary | ICD-10-CM

## 2022-08-04 MED ORDER — CITALOPRAM HYDROBROMIDE 20 MG PO TABS: 20.0000 mg | ORAL_TABLET | Freq: Every day | ORAL | 0 refills | Status: DC

## 2022-08-04 NOTE — Patient Instructions (Signed)
Reduce your venlafaxine ER to 75 mg daily for 1-2 weeks, then stop.  At the same time, start citalopram 20 mg. Take 1/2 tablet daily for 1 week, then increase to 1 full tablet thereafter.  Stop by the lab prior to leaving today. I will notify you of your results once received.   Please schedule a follow up visit for 4-6 weeks for follow up of anxiety/depression. This can be virtual.   It was a pleasure to see you today!  Preventive Care 42-42 Years Old, Male Preventive care refers to lifestyle choices and visits with your health care provider that can promote health and wellness. Preventive care visits are also called wellness exams. What can I expect for my preventive care visit? Counseling During your preventive care visit, your health care provider may ask about your: Medical history, including: Past medical problems. Family medical history. Current health, including: Emotional well-being. Home life and relationship well-being. Sexual activity. Lifestyle, including: Alcohol, nicotine or tobacco, and drug use. Access to firearms. Diet, exercise, and sleep habits. Safety issues such as seatbelt and bike helmet use. Sunscreen use. Work and work Astronomer. Physical exam Your health care provider will check your: Height and weight. These may be used to calculate your BMI (body mass index). BMI is a measurement that tells if you are at a healthy weight. Waist circumference. This measures the distance around your waistline. This measurement also tells if you are at a healthy weight and may help predict your risk of certain diseases, such as type 2 diabetes and high blood pressure. Heart rate and blood pressure. Body temperature. Skin for abnormal spots. What immunizations do I need?  Vaccines are usually given at various ages, according to a schedule. Your health care provider will recommend vaccines for you based on your age, medical history, and lifestyle or other factors, such  as travel or where you work. What tests do I need? Screening Your health care provider may recommend screening tests for certain conditions. This may include: Lipid and cholesterol levels. Diabetes screening. This is done by checking your blood sugar (glucose) after you have not eaten for a while (fasting). Hepatitis B test. Hepatitis C test. HIV (human immunodeficiency virus) test. STI (sexually transmitted infection) testing, if you are at risk. Lung cancer screening. Prostate cancer screening. Colorectal cancer screening. Talk with your health care provider about your test results, treatment options, and if necessary, the need for more tests. Follow these instructions at home: Eating and drinking  Eat a diet that includes fresh fruits and vegetables, whole grains, lean protein, and low-fat dairy products. Take vitamin and mineral supplements as recommended by your health care provider. Do not drink alcohol if your health care provider tells you not to drink. If you drink alcohol: Limit how much you have to 0-2 drinks a day. Know how much alcohol is in your drink. In the [place] one drink equals one 12 oz bottle of beer (355 mL), one 5 oz glass of wine (148 mL), or one 1 oz glass of hard liquor (44 mL). Lifestyle Brush your teeth every morning and night with fluoride toothpaste. Floss one time each day. Exercise for at least 30 minutes 5 or more days each week. Do not use any products that contain nicotine or tobacco. These products include cigarettes, chewing tobacco, and vaping devices, such as e-cigarettes. If you need help quitting, ask your health care provider. Do not use drugs. If you are sexually active, practice safe sex. Use a condom or  other form of protection to prevent STIs. Take aspirin only as told by your health care provider. Make sure that you understand how much to take and what form to take. Work with your health care provider to find out whether it is safe and  beneficial for you to take aspirin daily. Find healthy ways to manage stress, such as: Meditation, yoga, or listening to music. Journaling. Talking to a trusted person. Spending time with friends and family. Minimize exposure to UV radiation to reduce your risk of skin cancer. Safety Always wear your seat belt while driving or riding in a vehicle. Do not drive: If you have been drinking alcohol. Do not ride with someone who has been drinking. When you are tired or distracted. While texting. If you have been using any mind-altering substances or drugs. Wear a helmet and other protective equipment during sports activities. If you have firearms in your house, make sure you follow all gun safety procedures. What's next? Go to your health care provider once a year for an annual wellness visit. Ask your health care provider how often you should have your eyes and teeth checked. Stay up to date on all vaccines. This information is not intended to replace advice given to you by your health care provider. Make sure you discuss any questions you have with your health care provider. Document Revised: 02/18/2021 Document Reviewed: 02/18/2021 Elsevier Patient Education  2023 [employer].

## 2022-08-04 NOTE — Assessment & Plan Note (Signed)
Following with neurosurgery.  Plan is for surgery in January 2024.

## 2022-08-04 NOTE — Assessment & Plan Note (Signed)
Controlled.  Continue omeprazole 20 mg for which he uses on occasion.

## 2022-08-04 NOTE — Assessment & Plan Note (Signed)
Above goal initially, improved on recheck.  Continue lisinopril 20 mg daily. CMP pending. 

## 2022-08-04 NOTE — Assessment & Plan Note (Signed)
Immunizations UTD.  Discussed the importance of a healthy diet and regular exercise in order for weight loss, and to reduce the risk of further co-morbidity.  Exam stable. Labs pending.  Follow up in 1 year for repeat physical.  

## 2022-08-04 NOTE — Progress Notes (Signed)
Subjective:    Patient ID: Samuel Cabrera, male    DOB: 08-09-1981, 41 y.o.   MRN: [id number]  HPI  Samuel Cabrera is a very pleasant 41 y.o. male who presents today for complete physical and follow up of chronic conditions.  Following with [facility] and is managed on venlafaxine ER 75 mg and is taking 150 mg daily. Previously managed on 225 mg but he cut his dose down. The venlafaxine has made him feel more irritable and frustrated. He doesn't feel like it's helping as much. Symptoms include irritability, feeling overwhelmed, no motivation to do anything, nothing excites him. He was initiated on Zoloft in the past but this caused him to feel over excited.    Immunizations: -Tetanus: 2018 -Influenza: Declines  Diet: Fair diet.  Exercise: No regular exercise.  Eye exam: Completed several years ago.  Dental exam: Completes semi-annually   BP Readings from Last 3 Encounters:  08/04/22 136/86  11/26/21 126/74  04/02/21 132/78   He denies headaches, dizziness, blurred vision, chest pain.      Review of Systems  Constitutional:  Negative for unexpected weight change.  HENT:  Negative for rhinorrhea.   Respiratory:  Negative for cough and shortness of breath.   Cardiovascular:  Negative for chest pain.  Gastrointestinal:  Negative for constipation and diarrhea.  Genitourinary:  Negative for difficulty urinating.  Musculoskeletal:  Negative for arthralgias and myalgias.  Skin:  Negative for rash.  Allergic/Immunologic: Negative for environmental allergies.  Neurological:  Negative for dizziness and headaches.  Psychiatric/Behavioral:  The patient is nervous/anxious.        See HPI         Past Medical History:  Diagnosis Date   Anxiety    Chronic kidney disease    lithotripsy- 2013   Degenerative disc disease, lumbar    in lower back, hnp- cervical   Depression    medical treatment, states psych. gave him Clonidine in addition to other drugs for depression &  ^BP   GERD (gastroesophageal reflux disease)    Hypertension    medical tretment for 2-79yrs, yet reports lack of compliance on & off. Pt. agrees to take now leading up to surgery    Social History   Socioeconomic History   Marital status: Married    Spouse name: Not on file   Number of children: Not on file   Years of education: Not on file   Highest education level: Not on file  Occupational History   Not on file  Tobacco Use   Smoking status: Every Day    Packs/day: 1.00    Years: 10.00    Total pack years: 10.00    Types: Cigarettes    Last attempt to quit: 10/13/2009    Years since quitting: 12.8   Smokeless tobacco: Former  Substance and Sexual Activity   Alcohol use: Yes    Alcohol/week: 2.0 standard drinks of alcohol    Types: 2 Cans of beer per week    Comment: daily   Drug use: No   Sexual activity: Not on file    Comment: did not ask  Other Topics Concern   Not on file  Social History Narrative   Married.   Works in [employer].   Travels around [facility] for work.   Social Determinants of Health   Financial Resource Strain: Not on file  Food Insecurity: Not on file  Transportation Needs: Not on file  Physical Activity: Not on file  Stress: Not on  file  Social Connections: Not on file  Intimate Partner Violence: Not on file    Past Surgical History:  Procedure Laterality Date   ANTERIOR CERVICAL DECOMP/DISCECTOMY FUSION N/A 10/20/2012   Procedure: ANTERIOR CERVICAL DECOMPRESSION/DISCECTOMY FUSION 1 LEVEL;  Surgeon: Tia Alert, MD;  Location: [facility]UR[facility];  Service: Neurosurgery;  Laterality: N/A;   CARPAL TUNNEL RELEASE     both hands    EYE SURGERY     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    Family History  Problem Relation Age of Onset   Lung cancer Mother    Depression Mother    Hyperlipidemia Mother    Hypertension Father    Depression Father    Cancer Maternal Grandmother        Unsure   COPD Maternal Grandmother    Heart disease Maternal  Grandmother    Throat cancer Maternal Grandfather    Cancer Paternal Grandfather    Stroke Paternal Grandfather     Allergies  Allergen Reactions   Amoxicillin Anaphylaxis   Neurontin [Gabapentin] Other (See Comments)    hallucinations    Current Outpatient Medications on File Prior to Visit  Medication Sig Dispense Refill   diazepam (VALIUM) 5 MG tablet Take 1 tablet by mouth 30-60 minutes prior to flight as needed for anxiety. 4 tablet 0   lisinopril (ZESTRIL) 20 MG tablet Take 1 tablet (20 mg total) by mouth daily. for blood pressure. Office visit required for further refills. 30 tablet 0   omeprazole (PRILOSEC) 20 MG capsule Take 1 capsule (20 mg total) by mouth daily. For heartbun 90 capsule 0   No current facility-administered medications on file prior to visit.    BP 136/86   Pulse 80   Temp 98.2 F (36.8 C) (Temporal)   Ht 5' 10.5" (1.791 m)   Wt 224 lb (101.6 kg)   SpO2 98%   BMI 31.69 kg/m  Objective:   Physical Exam HENT:     Right Ear: Tympanic membrane and ear canal normal.     Left Ear: Tympanic membrane and ear canal normal.     Nose: Nose normal.     Right Sinus: No maxillary sinus tenderness or frontal sinus tenderness.     Left Sinus: No maxillary sinus tenderness or frontal sinus tenderness.  Eyes:     Conjunctiva/sclera: Conjunctivae normal.  Neck:     Thyroid: No thyromegaly.     Vascular: No carotid bruit.  Cardiovascular:     Rate and Rhythm: Normal rate and regular rhythm.     Heart sounds: Normal heart sounds.  Pulmonary:     Effort: Pulmonary effort is normal.     Breath sounds: Normal breath sounds. No wheezing or rales.  Abdominal:     General: Bowel sounds are normal.     Palpations: Abdomen is soft.     Tenderness: There is no abdominal tenderness.  Musculoskeletal:        General: Normal range of motion.     Cervical back: Neck supple.  Skin:    General: Skin is warm and dry.  Neurological:     Mental Status: He is alert and  oriented to person, place, and time.     Cranial Nerves: No cranial nerve deficit.     Deep Tendon Reflexes: Reflexes are normal and symmetric.  Psychiatric:        Mood and Affect: Mood normal.           Assessment & Plan:  Problem List Items Addressed This Visit       Cardiovascular and Mediastinum   Essential hypertension    Above goal initially, improved on recheck.  Continue lisinopril 20 mg daily. CMP pending.      Relevant Orders   Lipid panel   Comprehensive metabolic panel     Digestive   Gastroesophageal reflux disease    Controlled.  Continue omeprazole 20 mg for which he uses on occasion.         Other   MDD (major depressive disorder)    Uncontrolled on current regimen.  Wean off venlafaxine by reducing to 75 mg daily x 1-2 weeks, then stop. Meanwhile, start citalopram 10 mg while weaning off venlafaxine x 1 week, then increase to 20 mg one week later.   We will plan to see him back for follow up in 4-6 weeks.       Relevant Medications   citalopram (CELEXA) 20 MG tablet   Encounter for annual general medical examination with abnormal findings in adult - Primary    Immunizations UTD.  Discussed the importance of a healthy diet and regular exercise in order for weight loss, and to reduce the risk of further co-morbidity.  Exam stable. Labs pending.  Follow up in 1 year for repeat physical.       Neck pain with history of cervical spinal surgery    Following with neurosurgery.  Plan is for surgery in January 2024.      Other Visit Diagnoses     Screening for HIV (human immunodeficiency virus)       Relevant Orders   HIV antibody (with reflex)   Encounter for hepatitis C screening test for low risk patient       Relevant Orders   Hepatitis C Antibody          Doreene Nest, NP

## 2022-08-04 NOTE — Assessment & Plan Note (Signed)
Uncontrolled on current regimen.  Wean off venlafaxine by reducing to 75 mg daily x 1-2 weeks, then stop. Meanwhile, start citalopram 10 mg while weaning off venlafaxine x 1 week, then increase to 20 mg one week later.   We will plan to see him back for follow up in 4-6 weeks.

## 2022-08-05 LAB — COMPREHENSIVE METABOLIC PANEL
ALT: 12 U/L (ref 0–53)
AST: 15 U/L (ref 0–37)
Albumin: 4.5 g/dL (ref 3.5–5.2)
Alkaline Phosphatase: 74 U/L (ref 39–117)
BUN: 10 mg/dL (ref 6–23)
CO2: 29 mEq/L (ref 19–32)
Calcium: 9.5 mg/dL (ref 8.4–10.5)
Chloride: 104 mEq/L (ref 96–112)
Creatinine, Ser: 1 mg/dL (ref 0.40–1.50)
GFR: 92.94 mL/min (ref >60.00)
Glucose, Bld: 97 mg/dL (ref 70–99)
Potassium: 4 mEq/L (ref 3.5–5.1)
Sodium: 141 mEq/L (ref 135–145)
Total Bilirubin: 0.4 mg/dL (ref 0.2–1.2)
Total Protein: 6.9 g/dL (ref 6.0–8.3)

## 2022-08-05 LAB — LIPID PANEL
Cholesterol: 207 mg/dL — ABNORMAL HIGH (ref 0–200)
HDL: 37.3 mg/dL — ABNORMAL LOW (ref >39.00)
LDL Cholesterol: 133 mg/dL — ABNORMAL HIGH (ref 0–99)
NonHDL: 170.13
Total CHOL/HDL Ratio: 6
Triglycerides: 185 mg/dL — ABNORMAL HIGH (ref 0.0–149.0)
VLDL: 37 mg/dL (ref 0.0–40.0)

## 2022-08-05 LAB — HIV ANTIBODY (ROUTINE TESTING W REFLEX): HIV 1&2 Ab, 4th Generation: NONREACTIVE

## 2022-08-05 LAB — HEPATITIS C ANTIBODY: Hepatitis C Ab: NONREACTIVE

## 2022-09-07 ENCOUNTER — Other Ambulatory Visit: Payer: Self-pay | Admitting: Primary Care

## 2022-09-07 DIAGNOSIS — I10 Essential (primary) hypertension: Secondary | ICD-10-CM

## 2022-09-08 ENCOUNTER — Ambulatory Visit: Payer: 59 | Admitting: Primary Care

## 2022-09-10 ENCOUNTER — Encounter: Payer: Self-pay | Admitting: Primary Care

## 2022-09-10 ENCOUNTER — Ambulatory Visit: Payer: 59 | Admitting: Primary Care

## 2022-09-10 VITALS — BP 118/72 | HR 70 | Temp 98.5°F | Ht 70.5 in | Wt 221.0 lb

## 2022-09-10 DIAGNOSIS — F329 Major depressive disorder, single episode, unspecified: Secondary | ICD-10-CM

## 2022-09-10 MED ORDER — BUPROPION HCL ER (XL) 150 MG PO TB24: 150.0000 mg | ORAL_TABLET | Freq: Every day | ORAL | 0 refills | Status: DC

## 2022-09-10 NOTE — Assessment & Plan Note (Signed)
Improved but not quite at goal.  Continue citalopram 20 mg daily. Add bupropion XL 150 mg daily.  We will follow back up virtually in 1 month.

## 2022-09-10 NOTE — Progress Notes (Addendum)
Subjective:    Patient ID: Samuel Cabrera, male    DOB: 04/20/1981, 42 y.o.   MRN: [id number]  HPI  Samuel Cabrera is a very pleasant 42 y.o. male  has a past medical history of Anxiety, Chronic kidney disease, Degenerative disc disease, lumbar, Depression, GERD (gastroesophageal reflux disease), and Hypertension. who presents today for follow-up of depression.  He was last evaluated on 08/04/2022 for his annual physical when he mentioned that he was following with [facility] and managed on venlafaxine ER 75 mg daily, taking 150 mg daily.  He mentioned that the venlafaxine has made him far more irritable and frustrated, also felt like it was not helping.  Given his symptoms we weaned him off venlafaxine and initiated citalopram 10 mg with a goal of 20 mg daily.  He is here for follow-up today.  Since last visit he's feeling better. His anger and frustration have improved. His overall mood has improved. He has more energy to do things and some motivation to things. He feels that his motivation to do things is still lacking. He denies any issues since starting citalopram. He is feeling >50% better than before.    Review of Systems  Gastrointestinal:  Negative for nausea.  Neurological:  Negative for headaches.  Psychiatric/Behavioral:  Positive for sleep disturbance. Negative for suicidal ideas.        See HPI         Past Medical History:  Diagnosis Date   Anxiety    Chronic kidney disease    lithotripsy- 2013   Degenerative disc disease, lumbar    in lower back, hnp- cervical   Depression    medical treatment, states psych. gave him Clonidine in addition to other drugs for depression & ^BP   GERD (gastroesophageal reflux disease)    Hypertension    medical tretment for 2-77yrs, yet reports lack of compliance on & off. Pt. agrees to take now leading up to surgery    Social History   Socioeconomic History   Marital status: Married    Spouse name: Not on file   Number of  children: Not on file   Years of education: Not on file   Highest education level: Not on file  Occupational History   Not on file  Tobacco Use   Smoking status: Every Day    Packs/day: 1.00    Years: 10.00    Total pack years: 10.00    Types: Cigarettes    Last attempt to quit: 10/13/2009    Years since quitting: 12.9   Smokeless tobacco: Former  Substance and Sexual Activity   Alcohol use: Yes    Alcohol/week: 2.0 standard drinks of alcohol    Types: 2 Cans of beer per week    Comment: daily   Drug use: No   Sexual activity: Not on file    Comment: did not ask  Other Topics Concern   Not on file  Social History Narrative   Married.   Works in [employer].   Travels around [facility] for work.   Social Determinants of Health   Financial Resource Strain: Not on file  Food Insecurity: Not on file  Transportation Needs: Not on file  Physical Activity: Not on file  Stress: Not on file  Social Connections: Not on file  Intimate Partner Violence: Not on file    Past Surgical History:  Procedure Laterality Date   ANTERIOR CERVICAL DECOMP/DISCECTOMY FUSION N/A 10/20/2012   Procedure: ANTERIOR CERVICAL DECOMPRESSION/DISCECTOMY FUSION 1 LEVEL;  Surgeon: Eustace Moore, MD;  Location: [facility];  Service: Neurosurgery;  Laterality: N/A;   CARPAL TUNNEL RELEASE     both hands    EYE SURGERY     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    Family History  Problem Relation Age of Onset   Lung cancer Mother    Depression Mother    Hyperlipidemia Mother    Hypertension Father    Depression Father    Cancer Maternal Grandmother        Unsure   COPD Maternal Grandmother    Heart disease Maternal Grandmother    Throat cancer Maternal Grandfather    Cancer Paternal Grandfather    Stroke Paternal Grandfather     Allergies  Allergen Reactions   Amoxicillin Anaphylaxis   Neurontin [Gabapentin] Other (See Comments)    hallucinations    Current Outpatient Medications on File Prior to  Visit  Medication Sig Dispense Refill   citalopram (CELEXA) 20 MG tablet Take 1 tablet (20 mg total) by mouth daily. For depression/anxiety 90 tablet 0   diazepam (VALIUM) 5 MG tablet Take 1 tablet by mouth 30-60 minutes prior to flight as needed for anxiety. 4 tablet 0   lisinopril (ZESTRIL) 20 MG tablet Take 1 tablet (20 mg total) by mouth daily. for blood pressure. 90 tablet 2   omeprazole (PRILOSEC) 20 MG capsule Take 1 capsule (20 mg total) by mouth daily. For heartbun 90 capsule 0   No current facility-administered medications on file prior to visit.    BP 118/72   Pulse 70   Temp 98.5 F (36.9 C) (Temporal)   Ht 5' 10.5" (1.791 m)   Wt 221 lb (100.2 kg)   SpO2 98%   BMI 31.26 kg/m  Objective:   Physical Exam Cardiovascular:     Rate and Rhythm: Normal rate and regular rhythm.  Pulmonary:     Effort: Pulmonary effort is normal.     Breath sounds: Normal breath sounds. No wheezing or rales.  Musculoskeletal:     Cervical back: Neck supple.  Skin:    General: Skin is warm and dry.  Neurological:     Mental Status: He is alert and oriented to person, place, and time.  Psychiatric:        Mood and Affect: Mood normal.           Assessment & Plan:  Major depressive disorder, remission status unspecified, unspecified whether recurrent Assessment & Plan: Improved but not quite at goal.  Continue citalopram 20 mg daily. Add bupropion XL 150 mg daily.  We will follow back up virtually in 1 month.  Orders: -     buPROPion HCl ER (XL); Take 1 tablet (150 mg total) by mouth daily. For depression  Dispense: 90 tablet; Refill: 0        Pleas Koch, NP

## 2022-09-10 NOTE — Patient Instructions (Signed)
Start bupropion XL 150 mg once daily for depression. Continue taking citalopram 20 mg daily for depression.  Schedule a visit in 1 month for follow up. This can be virtual.  It was a pleasure to see you today!

## 2022-10-10 HISTORY — PX: ANTERIOR CERVICAL DECOMP/DISCECTOMY FUSION: SHX1161

## 2022-10-13 ENCOUNTER — Other Ambulatory Visit: Payer: Self-pay | Admitting: Primary Care

## 2022-10-13 DIAGNOSIS — F320 Major depressive disorder, single episode, mild: Secondary | ICD-10-CM

## 2022-11-30 ENCOUNTER — Other Ambulatory Visit: Payer: Self-pay | Admitting: Neurological Surgery

## 2022-11-30 DIAGNOSIS — S12591K Other nondisplaced fracture of sixth cervical vertebra, subsequent encounter for fracture with nonunion: Secondary | ICD-10-CM

## 2022-12-06 ENCOUNTER — Ambulatory Visit
Admission: RE | Admit: 2022-12-06 | Discharge: 2022-12-06 | Disposition: A | Discharge status: Home / Self Care | Payer: 59 | Source: Ambulatory Visit | Attending: Neurological Surgery | Admitting: Neurological Surgery

## 2022-12-06 DIAGNOSIS — S12591K Other nondisplaced fracture of sixth cervical vertebra, subsequent encounter for fracture with nonunion: Secondary | ICD-10-CM

## 2023-09-13 ENCOUNTER — Other Ambulatory Visit: Payer: Self-pay | Admitting: Primary Care

## 2023-09-13 DIAGNOSIS — I10 Essential (primary) hypertension: Secondary | ICD-10-CM

## 2023-09-13 NOTE — Telephone Encounter (Signed)
 Patient is due for CPE/follow up, this will be required prior to any further refills.  Please schedule, thank you!

## 2023-09-14 NOTE — Telephone Encounter (Signed)
 Scheduled patient for cpe/ labs

## 2023-09-29 ENCOUNTER — Ambulatory Visit (INDEPENDENT_AMBULATORY_CARE_PROVIDER_SITE_OTHER): Payer: No Typology Code available for payment source | Admitting: Primary Care

## 2023-09-29 ENCOUNTER — Encounter: Payer: Self-pay | Admitting: Primary Care

## 2023-09-29 VITALS — BP 138/88 | HR 79 | Temp 98.2°F | Ht 70.5 in | Wt 218.0 lb

## 2023-09-29 DIAGNOSIS — Z0001 Encounter for general adult medical examination with abnormal findings: Secondary | ICD-10-CM

## 2023-09-29 DIAGNOSIS — S12500A Unspecified displaced fracture of sixth cervical vertebra, initial encounter for closed fracture: Secondary | ICD-10-CM | POA: Insufficient documentation

## 2023-09-29 DIAGNOSIS — Z0000 Encounter for general adult medical examination without abnormal findings: Secondary | ICD-10-CM

## 2023-09-29 DIAGNOSIS — I10 Essential (primary) hypertension: Secondary | ICD-10-CM

## 2023-09-29 DIAGNOSIS — K219 Gastro-esophageal reflux disease without esophagitis: Secondary | ICD-10-CM

## 2023-09-29 DIAGNOSIS — M47812 Spondylosis without myelopathy or radiculopathy, cervical region: Secondary | ICD-10-CM

## 2023-09-29 DIAGNOSIS — E785 Hyperlipidemia, unspecified: Secondary | ICD-10-CM | POA: Insufficient documentation

## 2023-09-29 DIAGNOSIS — F320 Major depressive disorder, single episode, mild: Secondary | ICD-10-CM | POA: Diagnosis not present

## 2023-09-29 LAB — LIPID PANEL
Cholesterol: 198 mg/dL (ref 0–200)
HDL: 43.5 mg/dL (ref >39.00)
LDL Cholesterol: 128 mg/dL — ABNORMAL HIGH (ref 0–99)
NonHDL: 154.73
Total CHOL/HDL Ratio: 5
Triglycerides: 133 mg/dL (ref 0.0–149.0)
VLDL: 26.6 mg/dL (ref 0.0–40.0)

## 2023-09-29 LAB — COMPREHENSIVE METABOLIC PANEL
ALT: 12 U/L (ref 0–53)
AST: 15 U/L (ref 0–37)
Albumin: 4.6 g/dL (ref 3.5–5.2)
Alkaline Phosphatase: 81 U/L (ref 39–117)
BUN: 10 mg/dL (ref 6–23)
CO2: 29 meq/L (ref 19–32)
Calcium: 9.6 mg/dL (ref 8.4–10.5)
Chloride: 105 meq/L (ref 96–112)
Creatinine, Ser: 0.96 mg/dL (ref 0.40–1.50)
GFR: 96.82 mL/min (ref >60.00)
Glucose, Bld: 90 mg/dL (ref 70–99)
Potassium: 4.1 meq/L (ref 3.5–5.1)
Sodium: 142 meq/L (ref 135–145)
Total Bilirubin: 1 mg/dL (ref 0.2–1.2)
Total Protein: 7 g/dL (ref 6.0–8.3)

## 2023-09-29 MED ORDER — CITALOPRAM HYDROBROMIDE 40 MG PO TABS: 40.0000 mg | ORAL_TABLET | Freq: Every day | ORAL | 3 refills | Status: AC

## 2023-09-29 NOTE — Progress Notes (Signed)
Subjective:    Patient ID: Samuel Cabrera, male    DOB: 1981-08-30, 43 y.o.   MRN: [id number]  HPI  Samuel Cabrera is a very pleasant 43 y.o. male who presents today for complete physical and follow up of chronic conditions.  He would also like to discuss anxiety/depression. Currently managed on citalopram 20 mg daily which has helped overall for his depression. He continues to experience aggressive thoughts and irritability.  Sometimes feels like "I am just going through the motions".  He has noticed a benefit with citalopram 20 mg.  Last year he was prescribed bupropion XL 150 mg daily because of ongoing symptoms.  This was not effective so he discontinued the medication after 3 months.  He denies SI/HI.   Immunizations: -Tetanus: Completed in 2018 -Influenza: Declines influenza vaccine.   Diet: Fair diet.  Exercise: Active and exercises regularly   Eye exam: Completed years ago.  Dental exam: Completed one year ago  BP Readings from Last 3 Encounters:  09/29/23 138/88  09/10/22 118/72  08/04/22 136/86        Review of Systems  Constitutional:  Negative for unexpected weight change.  HENT:  Negative for rhinorrhea.   Respiratory:  Negative for cough and shortness of breath.   Cardiovascular:  Negative for chest pain.  Gastrointestinal:  Negative for constipation and diarrhea.  Genitourinary:  Negative for difficulty urinating.  Musculoskeletal:  Positive for arthralgias and back pain. Negative for myalgias.  Skin:  Negative for rash.  Allergic/Immunologic: Negative for environmental allergies.  Neurological:  Negative for dizziness and headaches.  Psychiatric/Behavioral:  The patient is nervous/anxious.        See HPI         Past Medical History:  Diagnosis Date   Anxiety    Chronic kidney disease    lithotripsy- 2013   Degenerative disc disease, lumbar    in lower back, hnp- cervical   Depression    medical treatment, states psych. gave him  Clonidine in addition to other drugs for depression & ^BP   GERD (gastroesophageal reflux disease)    Hypertension    medical tretment for 2-52yrs, yet reports lack of compliance on & off. Pt. agrees to take now leading up to surgery    Social History   Socioeconomic History   Marital status: Married    Spouse name: Not on file   Number of children: Not on file   Years of education: Not on file   Highest education level: Not on file  Occupational History   Not on file  Tobacco Use   Smoking status: Every Day    Current packs/day: 0.00    Average packs/day: 1 pack/day for 10.0 years (10.0 ttl pk-yrs)    Types: Cigarettes    Start date: 10/14/1999    Last attempt to quit: 10/13/2009    Years since quitting: 13.9   Smokeless tobacco: Former  Substance and Sexual Activity   Alcohol use: Yes    Alcohol/week: 2.0 standard drinks of alcohol    Types: 2 Cans of beer per week    Comment: daily   Drug use: No   Sexual activity: Not on file    Comment: did not ask  Other Topics Concern   Not on file  Social History Narrative   Married.   Works in [employer].   Travels around [facility] for work.   Social Drivers of Corporate investment banker Strain: Not on file  Food Insecurity: Not on  file  Transportation Needs: Not on file  Physical Activity: Not on file  Stress: Not on file  Social Connections: Not on file  Intimate Partner Violence: Not on file    Past Surgical History:  Procedure Laterality Date   ANTERIOR CERVICAL DECOMP/DISCECTOMY FUSION N/A 10/20/2012   Procedure: ANTERIOR CERVICAL DECOMPRESSION/DISCECTOMY FUSION 1 LEVEL;  Surgeon: Tia Alert, MD;  Location: [facility] NEURO ORS;  Service: Neurosurgery;  Laterality: N/A;   ANTERIOR CERVICAL DECOMP/DISCECTOMY FUSION  10/10/2022   CARPAL TUNNEL RELEASE     both hands    EYE SURGERY     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    Family History  Problem Relation Age of Onset   Lung cancer Mother    Depression Mother     Hyperlipidemia Mother    Hypertension Father    Depression Father    Cancer Maternal Grandmother        Unsure   COPD Maternal Grandmother    Heart disease Maternal Grandmother    Throat cancer Maternal Grandfather    Cancer Paternal Grandfather    Stroke Paternal Grandfather     Allergies  Allergen Reactions   Amoxicillin Anaphylaxis   Neurontin [Gabapentin] Other (See Comments)    hallucinations    Current Outpatient Medications on File Prior to Visit  Medication Sig Dispense Refill   lisinopril (ZESTRIL) 20 MG tablet TAKE 1 TABLET BY MOUTH ONCE  DAILY FOR BLOOD PRESSURE 30 tablet 0   omeprazole (PRILOSEC) 20 MG capsule Take 1 capsule (20 mg total) by mouth daily. For heartbun (Patient not taking: Reported on 09/29/2023) 90 capsule 0   No current facility-administered medications on file prior to visit.    BP 138/88   Pulse 79   Temp 98.2 F (36.8 C) (Temporal)   Ht 5' 10.5" (1.791 m)   Wt 218 lb (98.9 kg)   SpO2 99%   BMI 30.84 kg/m  Objective:   Physical Exam HENT:     Right Ear: Tympanic membrane and ear canal normal.     Left Ear: Tympanic membrane and ear canal normal.  Eyes:     Pupils: Pupils are equal, round, and reactive to light.  Cardiovascular:     Rate and Rhythm: Normal rate and regular rhythm.  Pulmonary:     Effort: Pulmonary effort is normal.     Breath sounds: Normal breath sounds.  Abdominal:     General: Bowel sounds are normal.     Palpations: Abdomen is soft.     Tenderness: There is no abdominal tenderness.  Musculoskeletal:        General: Normal range of motion.     Cervical back: Neck supple.  Skin:    General: Skin is warm and dry.  Neurological:     Mental Status: He is alert and oriented to person, place, and time.     Cranial Nerves: No cranial nerve deficit.     Deep Tendon Reflexes:     Reflex Scores:      Patellar reflexes are 2+ on the right side and 2+ on the left side. Psychiatric:        Mood and Affect: Mood  normal.           Assessment & Plan:  Encounter for annual general medical examination with abnormal findings in adult Assessment & Plan: Immunizations UTD. Declines influenza vaccine.   Discussed the importance of a healthy diet and regular exercise in order for weight loss, and to reduce  the risk of further co-morbidity.  Exam stable. Labs pending.  Follow up in 1 year for repeat physical.    Essential hypertension Assessment & Plan: Borderline high which could be from cervical spine pain.  Continue lisinopril 20 mg daily for now. CMP pending.  Orders: -     Comprehensive metabolic panel  Hyperlipidemia, unspecified hyperlipidemia type Assessment & Plan: Repeat lipid panel pending.  Orders: -     Lipid panel -     Comprehensive metabolic panel  Current mild episode of major depressive disorder, unspecified whether recurrent (HCC) Assessment & Plan: Improved overall, but with symptoms of aggression and irritability as noted in HPI.  No improvement with bupropion XL 150 mg so we will increase citalopram to 40 mg daily. He will update.  Orders: -     Citalopram Hydrobromide; Take 1 tablet (40 mg total) by mouth daily. for anxiety and depression.  Dispense: 90 tablet; Refill: 3  Gastroesophageal reflux disease, unspecified whether esophagitis present Assessment & Plan: Controlled.  Continue omeprazole 20 mg daily as needed   Cervical spondylosis Assessment & Plan: Improved after surgery, complicated by fracture postsurgery. Following with  neurosurgery.         Doreene Nest, NP

## 2023-09-29 NOTE — Assessment & Plan Note (Signed)
Borderline high which could be from cervical spine pain.  Continue lisinopril 20 mg daily for now. CMP pending.

## 2023-09-29 NOTE — Assessment & Plan Note (Signed)
Improved after surgery, complicated by fracture postsurgery. Following with [facility].

## 2023-09-29 NOTE — Patient Instructions (Signed)
We increased her dose of citalopram to 40 mg daily.  I sent a new prescription to your pharmacy.  Stop by the lab prior to leaving today. I will notify you of your results once received.   It was a pleasure to see you today!

## 2023-09-29 NOTE — Assessment & Plan Note (Signed)
Improved overall, but with symptoms of aggression and irritability as noted in HPI.  No improvement with bupropion XL 150 mg so we will increase citalopram to 40 mg daily. He will update.

## 2023-09-29 NOTE — Assessment & Plan Note (Signed)
Controlled.  Continue omeprazole 20 mg daily as needed

## 2023-09-29 NOTE — Assessment & Plan Note (Signed)
Immunizations UTD. Declines influenza vaccine.   Discussed the importance of a healthy diet and regular exercise in order for weight loss, and to reduce the risk of further co-morbidity.  Exam stable. Labs pending.  Follow up in 1 year for repeat physical.  

## 2023-09-29 NOTE — Assessment & Plan Note (Signed)
Repeat lipid panel pending. 

## 2023-12-01 ENCOUNTER — Other Ambulatory Visit: Payer: Self-pay | Admitting: Primary Care

## 2023-12-01 DIAGNOSIS — I10 Essential (primary) hypertension: Secondary | ICD-10-CM

## 2024-08-22 ENCOUNTER — Other Ambulatory Visit: Payer: Self-pay | Admitting: Primary Care

## 2024-08-22 DIAGNOSIS — I10 Essential (primary) hypertension: Secondary | ICD-10-CM

## 2024-10-02 ENCOUNTER — Ambulatory Visit: Admitting: Primary Care

## 2024-10-02 ENCOUNTER — Encounter: Payer: Self-pay | Admitting: Primary Care

## 2024-10-02 VITALS — BP 110/64 | HR 70 | Temp 97.8°F | Ht 69.95 in | Wt 221.2 lb

## 2024-10-02 DIAGNOSIS — E785 Hyperlipidemia, unspecified: Secondary | ICD-10-CM | POA: Diagnosis not present

## 2024-10-02 DIAGNOSIS — I10 Essential (primary) hypertension: Secondary | ICD-10-CM | POA: Diagnosis not present

## 2024-10-02 DIAGNOSIS — R41840 Attention and concentration deficit: Secondary | ICD-10-CM | POA: Diagnosis not present

## 2024-10-02 DIAGNOSIS — K219 Gastro-esophageal reflux disease without esophagitis: Secondary | ICD-10-CM

## 2024-10-02 DIAGNOSIS — F320 Major depressive disorder, single episode, mild: Secondary | ICD-10-CM | POA: Diagnosis not present

## 2024-10-02 DIAGNOSIS — G4700 Insomnia, unspecified: Secondary | ICD-10-CM

## 2024-10-02 DIAGNOSIS — Z0001 Encounter for general adult medical examination with abnormal findings: Secondary | ICD-10-CM

## 2024-10-02 LAB — LIPID PANEL
Cholesterol: 175 mg/dL (ref 28–200)
HDL: 47.2 mg/dL
LDL Cholesterol: 86 mg/dL (ref 10–99)
NonHDL: 127.82
Total CHOL/HDL Ratio: 4
Triglycerides: 208 mg/dL — ABNORMAL HIGH (ref 10.0–149.0)
VLDL: 41.6 mg/dL — ABNORMAL HIGH (ref 0.0–40.0)

## 2024-10-02 LAB — CBC
HCT: 49.6 % (ref 39.0–52.0)
Hemoglobin: 17.3 g/dL — ABNORMAL HIGH (ref 13.0–17.0)
MCHC: 34.9 g/dL (ref 30.0–36.0)
MCV: 95 fl (ref 78.0–100.0)
Platelets: 289 10*3/uL (ref 150.0–400.0)
RBC: 5.22 Mil/uL (ref 4.22–5.81)
RDW: 12.6 % (ref 11.5–15.5)
WBC: 7 10*3/uL (ref 4.0–10.5)

## 2024-10-02 LAB — COMPREHENSIVE METABOLIC PANEL WITH GFR
ALT: 14 U/L (ref 3–53)
AST: 19 U/L (ref 5–37)
Albumin: 4.4 g/dL (ref 3.5–5.2)
Alkaline Phosphatase: 78 U/L (ref 39–117)
BUN: 9 mg/dL (ref 6–23)
CO2: 28 meq/L (ref 19–32)
Calcium: 9.4 mg/dL (ref 8.4–10.5)
Chloride: 104 meq/L (ref 96–112)
Creatinine, Ser: 0.99 mg/dL (ref 0.40–1.50)
GFR: 92.65 mL/min
Glucose, Bld: 107 mg/dL — ABNORMAL HIGH (ref 70–99)
Potassium: 3.7 meq/L (ref 3.5–5.1)
Sodium: 140 meq/L (ref 135–145)
Total Bilirubin: 0.5 mg/dL (ref 0.2–1.2)
Total Protein: 7.2 g/dL (ref 6.0–8.3)

## 2024-10-02 NOTE — Assessment & Plan Note (Signed)
 Controlled.  Continue citalopram  40 mg daily. Consider switching to venlafaxine for restlessness/difficulty concentrating.

## 2024-10-02 NOTE — Patient Instructions (Signed)
 Stop by the lab prior to leaving today. I will notify you of your results once received.   You will either be contacted via phone regarding your referral to psychology for ADHD testing, or you may receive a letter on your MyChart portal from our referral team with instructions for scheduling an appointment. Please let us  know if you have not been contacted by anyone within two weeks.  It was a pleasure to see you today!

## 2024-10-02 NOTE — Assessment & Plan Note (Signed)
 Repeat lipid panel pending.

## 2024-10-02 NOTE — Assessment & Plan Note (Signed)
 Declines influenza vaccine Tetanus up-to-date  Discussed the importance of a healthy diet and regular exercise in order for weight loss, and to reduce the risk of further co-morbidity.  Exam stable. Labs pending.  Follow up in 1 year for repeat physical.

## 2024-10-02 NOTE — Progress Notes (Signed)
 "  Subjective:    Patient ID: Samuel Cabrera, male    DOB: [birth year], 44 y.o.   MRN: [id number]  Samuel Cabrera is a very pleasant 44 y.o. male who presents today for complete physical and follow up of chronic conditions.  He would also like to discuss difficulty concentrating. Chronic since childhood, worse over the years. Symptoms include inability to focus on anything, has multiple projects for which he has not finished, difficulty sleeping as he's having mind racing thoughts, has difficulty with conversations and will interrupt.Samuel Cabrera His symptoms are affecting both work and home life. He denies feeling anxious. He drinks sweet tea throughout the day.  He feels well-managed on citalopram  40 mg daily for depression.  He believes he may have ADHD, has not been formally tested.  Immunizations: -Tetanus: Completed in 2018 -Influenza: declines  Diet: Fair diet.  Exercise: No regular exercise.  Eye exam: Completes annually  Dental exam: Completes semi-annually     BP Readings from Last 3 Encounters:  10/02/24 110/64  09/29/23 138/88  09/10/22 118/72       Review of Systems  Constitutional:  Negative for unexpected weight change.  HENT:  Negative for rhinorrhea.   Respiratory:  Negative for cough and shortness of breath.   Cardiovascular:  Negative for chest pain.  Gastrointestinal:  Negative for constipation and diarrhea.  Genitourinary:  Negative for difficulty urinating.  Musculoskeletal:  Positive for arthralgias.  Skin:  Negative for rash.  Allergic/Immunologic: Negative for environmental allergies.  Neurological:  Negative for dizziness and headaches.  Psychiatric/Behavioral:  Positive for decreased concentration.        See HPI         Past Medical History:  Diagnosis Date   Anxiety    Chronic kidney disease    lithotripsy- 2013   Degenerative disc disease, lumbar    in lower back, hnp- cervical   Depression    medical treatment, states psych. gave him  Clonidine  in addition to other drugs for depression & ^BP   GERD (gastroesophageal reflux disease)    Hypertension    medical tretment for 2-66yrs, yet reports lack of compliance on & off. Pt. agrees to take now leading up to surgery    Social History   Socioeconomic History   Marital status: Married    Spouse name: Not on file   Number of children: Not on file   Years of education: Not on file   Highest education level: Not on file  Occupational History   Not on file  Tobacco Use   Smoking status: Every Day    Current packs/day: 0.00    Average packs/day: 1 pack/day for 10.0 years (10.0 ttl pk-yrs)    Types: Cigarettes    Start date: 10/14/1999    Last attempt to quit: 10/13/2009    Years since quitting: 14.9   Smokeless tobacco: Former  Building Services Engineer status: Never Used  Substance and Sexual Activity   Alcohol use: Yes    Alcohol/week: 2.0 standard drinks of alcohol    Types: 2 Cans of beer per week    Comment: daily   Drug use: No   Sexual activity: Not on file    Comment: did not ask  Other Topics Concern   Not on file  Social History Narrative   Married.   Works in [employer].   Travels around [facility] for work.   Social Drivers of Health   Tobacco Use: High Risk (10/02/2024)   Patient History  Smoking Tobacco Use: Every Day    Smokeless Tobacco Use: Former    Passive Exposure: Not on Actuary Strain: Not on file  Food Insecurity: Not on file  Transportation Needs: Not on file  Physical Activity: Not on file  Stress: Not on file  Social Connections: Not on file  Intimate Partner Violence: Not on file  Depression (PHQ2-9): High Risk (10/02/2024)   Depression (PHQ2-9)    PHQ-2 Score: 16  Alcohol Screen: Not on file  Housing: Not on file  Utilities: Not on file  Health Literacy: Not on file    Past Surgical History:  Procedure Laterality Date   ANTERIOR CERVICAL DECOMP/DISCECTOMY FUSION N/A 10/20/2012   Procedure: ANTERIOR CERVICAL  DECOMPRESSION/DISCECTOMY FUSION 1 LEVEL;  Surgeon: Alm GORMAN Molt, MD;  Location: [facility];  Service: Neurosurgery;  Laterality: N/A;   ANTERIOR CERVICAL DECOMP/DISCECTOMY FUSION  10/10/2022   CARPAL TUNNEL RELEASE     both hands    EYE SURGERY     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    Family History  Problem Relation Age of Onset   Lung cancer Mother    Depression Mother    Hyperlipidemia Mother    Hypertension Father    Depression Father    Cancer Maternal Grandmother        Unsure   COPD Maternal Grandmother    Heart disease Maternal Grandmother    Throat cancer Maternal Grandfather    Cancer Paternal Grandfather    Stroke Paternal Grandfather     Allergies[1]  Medications Ordered Prior to Encounter[2]  BP 110/64   Pulse 70   Temp 97.8 F (36.6 C) (Oral)   Ht 5' 9.95 (1.777 m)   Wt 221 lb 3.2 oz (100.3 kg)   SpO2 98%   BMI 31.78 kg/m  Objective:   Physical Exam Constitutional:      Comments: Fidgets and restless during exam  HENT:     Right Ear: Tympanic membrane and ear canal normal.     Left Ear: Tympanic membrane and ear canal normal.  Eyes:     Pupils: Pupils are equal, round, and reactive to light.  Cardiovascular:     Rate and Rhythm: Normal rate and regular rhythm.  Pulmonary:     Effort: Pulmonary effort is normal.     Breath sounds: Normal breath sounds.  Abdominal:     General: Bowel sounds are normal.     Palpations: Abdomen is soft.     Tenderness: There is no abdominal tenderness.  Musculoskeletal:        General: Normal range of motion.     Cervical back: Neck supple.  Skin:    General: Skin is warm and dry.  Neurological:     Mental Status: He is alert and oriented to person, place, and time.     Cranial Nerves: No cranial nerve deficit.     Deep Tendon Reflexes:     Reflex Scores:      Patellar reflexes are 2+ on the right side and 2+ on the left side. Psychiatric:        Mood and Affect: Mood normal.     Physical  Exam        Assessment & Plan:  Encounter for annual general medical examination with abnormal findings in adult Assessment & Plan: Declines influenza vaccine Tetanus up-to-date  Discussed the importance of a healthy diet and regular exercise in order for weight loss, and to reduce the risk  of further co-morbidity.  Exam stable. Labs pending.  Follow up in 1 year for repeat physical.    Hyperlipidemia, unspecified hyperlipidemia type Assessment & Plan: Repeat lipid panel pending  Orders: -     Lipid panel -     CBC -     Comprehensive metabolic panel with GFR  Difficulty concentrating Assessment & Plan: Differentials include anxiety versus undiagnosed ADHD. We also discussed to limit caffeine use.  Referral placed to psychology for formal ADHD testing. Would avoid stimulant treatment given prior history of opioid abuse.  Orders: -     Ambulatory referral to Psychology  Essential hypertension Assessment & Plan: Controlled.  Continue lisinopril  20 mg daily.   Gastroesophageal reflux disease, unspecified whether esophagitis present Assessment & Plan: No concerns today.  Continue omeprazole  20 mg daily.   Current mild episode of major depressive disorder, unspecified whether recurrent Assessment & Plan: Controlled.  Continue citalopram  40 mg daily. Consider switching to venlafaxine for restlessness/difficulty concentrating.   Insomnia, unspecified type Assessment & Plan: Ongoing.  Has failed numerous prior treatments. Will refer for ADHD testing.     Assessment and Plan Assessment & Plan         Comer MARLA Gaskins, NP       [1]  Allergies Allergen Reactions   Amoxicillin Anaphylaxis   Neurontin [Gabapentin] Other (See Comments)    hallucinations  [2]  Current Outpatient Medications on File Prior to Visit  Medication Sig Dispense Refill   citalopram  (CELEXA ) 40 MG tablet Take 1 tablet (40 mg total) by mouth daily. for anxiety  and depression. 90 tablet 3   lisinopril  (ZESTRIL ) 20 MG tablet Take 1 tablet (20 mg total) by mouth daily. for blood pressure. 90 tablet 2   omeprazole  (PRILOSEC) 20 MG capsule Take 1 capsule (20 mg total) by mouth daily. For heartbun (Patient not taking: Reported on 10/02/2024) 90 capsule 0   No current facility-administered medications on file prior to visit.   "

## 2024-10-02 NOTE — Assessment & Plan Note (Signed)
 No concerns today.  Continue omeprazole  20 mg daily.

## 2024-10-02 NOTE — Assessment & Plan Note (Signed)
 Ongoing.  Has failed numerous prior treatments. Will refer for ADHD testing.

## 2024-10-02 NOTE — Assessment & Plan Note (Signed)
-   Controlled  - Continue lisinopril  20 mg daily

## 2024-10-02 NOTE — Assessment & Plan Note (Signed)
 Differentials include anxiety versus undiagnosed ADHD. We also discussed to limit caffeine use.  Referral placed to psychology for formal ADHD testing. Would avoid stimulant treatment given prior history of opioid abuse.

## 2024-10-03 ENCOUNTER — Other Ambulatory Visit

## 2024-10-03 ENCOUNTER — Ambulatory Visit: Payer: Self-pay | Admitting: Primary Care

## 2024-10-03 DIAGNOSIS — R739 Hyperglycemia, unspecified: Secondary | ICD-10-CM | POA: Diagnosis not present

## 2024-10-03 LAB — HEMOGLOBIN A1C: Hgb A1c MFr Bld: 5.4 % (ref 4.6–6.5)
